# Patient Record
Sex: Male | Born: 1975 | Race: Black or African American | Hispanic: Yes | Marital: Married | Smoking: Never smoker
Health system: Southern US, Community
[De-identification: ages and names within clinical notes are randomized; demographics above are authoritative.]

## PROBLEM LIST (undated history)

## (undated) DIAGNOSIS — N200 Calculus of kidney: Secondary | ICD-10-CM

## (undated) DIAGNOSIS — R51 Headache: Secondary | ICD-10-CM

## (undated) DIAGNOSIS — R519 Headache, unspecified: Secondary | ICD-10-CM

## (undated) HISTORY — PX: KIDNEY STONE SURGERY: SHX686

## (undated) HISTORY — PX: URETERAL STENT PLACEMENT: SHX822

---

## 2012-02-21 ENCOUNTER — Emergency Department (HOSPITAL_COMMUNITY): Payer: BC Managed Care – PPO

## 2012-02-21 ENCOUNTER — Encounter (HOSPITAL_COMMUNITY): Payer: Self-pay | Admitting: Physical Medicine and Rehabilitation

## 2012-02-21 ENCOUNTER — Emergency Department (HOSPITAL_COMMUNITY)
Admission: EM | Admit: 2012-02-21 | Discharge: 2012-02-21 | Disposition: A | Discharge status: Home / Self Care | Payer: BC Managed Care – PPO | Attending: Emergency Medicine | Admitting: Emergency Medicine

## 2012-02-21 DIAGNOSIS — R110 Nausea: Secondary | ICD-10-CM | POA: Insufficient documentation

## 2012-02-21 DIAGNOSIS — R109 Unspecified abdominal pain: Secondary | ICD-10-CM | POA: Insufficient documentation

## 2012-02-21 DIAGNOSIS — N200 Calculus of kidney: Secondary | ICD-10-CM | POA: Insufficient documentation

## 2012-02-21 DIAGNOSIS — R350 Frequency of micturition: Secondary | ICD-10-CM | POA: Insufficient documentation

## 2012-02-21 DIAGNOSIS — M549 Dorsalgia, unspecified: Secondary | ICD-10-CM | POA: Insufficient documentation

## 2012-02-21 HISTORY — DX: Calculus of kidney: N20.0

## 2012-02-21 LAB — URINALYSIS, ROUTINE W REFLEX MICROSCOPIC
Bilirubin Urine: NEGATIVE
Glucose, UA: NEGATIVE mg/dL
Hgb urine dipstick: NEGATIVE
Ketones, ur: NEGATIVE mg/dL
Leukocytes, UA: NEGATIVE
Protein, ur: NEGATIVE mg/dL
pH: 7.5 (ref 5.0–8.0)

## 2012-02-21 MED ORDER — KETOROLAC TROMETHAMINE 30 MG/ML IJ SOLN
30.0000 mg | Freq: Once | INTRAMUSCULAR | Status: AC
  Administered 2012-02-21: 30 mg via INTRAVENOUS
  Filled 2012-02-21: qty 1

## 2012-02-21 MED ORDER — HYDROCODONE-ACETAMINOPHEN 5-500 MG PO TABS: 1.0000 | ORAL_TABLET | Freq: Four times a day (QID) | ORAL | Status: AC | PRN

## 2012-02-21 NOTE — ED Provider Notes (Signed)
History     CSN: [id number]  Arrival date & time 02/21/12  1152   First MD Initiated Contact with Patient 02/21/12 1430      Chief Complaint  Patient presents with  . Flank Pain    (Consider location/radiation/quality/duration/timing/severity/associated sxs/prior treatment) HPI Comments: 36 year old male with a history of kidney stones presents with left-sided flank pain radiating around to his abdomen. He rates the pain 7/10, constant, and cramping. Admits to associated nausea and diaphoresis, increased frequency and urgency. Denies any dysuria or hematuria. No vomiting. He was diagnosed with kidney stones 6 years ago in [place] where he is from. Last kidney stone flare up was 4 years ago which is also when his last CT scan was. He has not tried any alleviating factors for his pain.   Patient is a 36 y.o. male presenting with flank pain. The history is provided by the patient.  Flank Pain Associated symptoms include abdominal pain, diaphoresis and nausea. Pertinent negatives include no chest pain or vomiting.    Past Medical History  Diagnosis Date  . Kidney stones     No past surgical history on file.  History reviewed. No pertinent family history.  History  Substance Use Topics  . Smoking status: Never Smoker   . Smokeless tobacco: Not on file  . Alcohol Use: No      Review of Systems  Constitutional: Positive for diaphoresis. Negative for appetite change.  Respiratory: Negative for shortness of breath.   Cardiovascular: Negative for chest pain.  Gastrointestinal: Positive for nausea and abdominal pain. Negative for vomiting.  Genitourinary: Positive for urgency, frequency and flank pain. Negative for dysuria and hematuria.  Musculoskeletal: Positive for back pain.  Skin: Negative for color change.  Neurological: Negative for dizziness and light-headedness.    Allergies  Review of patient's allergies indicates no known allergies.  Home Medications  No  current outpatient prescriptions on file.  BP 116/85  Pulse 55  Temp 98.3 F (36.8 C) (Oral)  Resp 18  SpO2 99%  Physical Exam  Constitutional: He is oriented to person, place, and time. He appears well-developed and well-nourished. No distress.  HENT:  Head: Normocephalic and atraumatic.  Mouth/Throat: Oropharynx is clear and moist.  Eyes: Conjunctivae normal are normal.  Neck: Normal range of motion. Neck supple.  Cardiovascular: Normal rate, regular rhythm and normal heart sounds.   Pulmonary/Chest: Effort normal and breath sounds normal.  Abdominal: Soft. Normal appearance and bowel sounds are normal. There is no tenderness. There is CVA tenderness (left sided).  Musculoskeletal: Normal range of motion.  Neurological: He is alert and oriented to person, place, and time.  Skin: Skin is warm and dry. He is not diaphoretic.  Psychiatric: He has a normal mood and affect. His behavior is normal.    ED Course  Procedures (including critical care time)   Labs Reviewed  URINALYSIS, ROUTINE W REFLEX MICROSCOPIC   Results for orders placed during the hospital encounter of 02/21/12  URINALYSIS, ROUTINE W REFLEX MICROSCOPIC      Component Value Range   Color, Urine YELLOW  YELLOW   APPearance CLEAR  CLEAR   Specific Gravity, Urine 1.012  1.005 - 1.030   pH 7.5  5.0 - 8.0   Glucose, UA NEGATIVE  NEGATIVE mg/dL   Hgb urine dipstick NEGATIVE  NEGATIVE   Bilirubin Urine NEGATIVE  NEGATIVE   Ketones, ur NEGATIVE  NEGATIVE mg/dL   Protein, ur NEGATIVE  NEGATIVE mg/dL   Urobilinogen, UA 1.0  0.0 -  1.0 mg/dL   Nitrite NEGATIVE  NEGATIVE   Leukocytes, UA NEGATIVE  NEGATIVE    No results found.   No diagnosis found.    MDM  36 y/o male with hx of kidney stone with left sided flank pain. Urine without hematuria. Patient appears in NAD. No abdominal tenderness. CVA tenderness on left. Obtaining CT scan to r/o stone.  Case discussed with Dr. Judd Lien who will take over care of  patient at this time.        Trevor Mace, PA-C 02/21/12 1557

## 2012-02-21 NOTE — ED Provider Notes (Signed)
Medical screening examination/treatment/procedure(s) were performed by non-physician practitioner and as supervising physician I was immediately available for consultation/collaboration.  Cheri Guppy, MD 02/21/12 [phone]

## 2012-02-21 NOTE — ED Notes (Signed)
MD at bedside. 

## 2012-02-21 NOTE — ED Notes (Signed)
Pt presents to department for evaluation of L sided flank pain radiating to abdomen. Ongoing since Saturday. Also states unable to void x2 days. States he has (6) kidney stones. 7/10 cramping pain at the time. Also states nausea. He is conscious alert and oriented x4.

## 2012-02-21 NOTE — ED Provider Notes (Signed)
Care assumed from Dr. Nino Parsley and Johnnette Gourd.  I agree with their notes, assessment and plan.  The patient was awaiting ct scan for possible kidney stone.  The ct was reviewed and shows no evidence of obstructing calculi, however there were several calculi within the renal parenchyma.  He is feeling better with toradol and will be discharged to home.    Geoffery Lyons, MD 02/21/12 [phone]

## 2012-07-05 ENCOUNTER — Ambulatory Visit (INDEPENDENT_AMBULATORY_CARE_PROVIDER_SITE_OTHER): Payer: BC Managed Care – PPO | Admitting: Internal Medicine

## 2012-07-05 VITALS — BP 118/88 | HR 86 | Temp 98.5°F | Resp 16 | Ht 69.0 in | Wt 199.0 lb

## 2012-07-05 DIAGNOSIS — Z8619 Personal history of other infectious and parasitic diseases: Secondary | ICD-10-CM

## 2012-07-05 DIAGNOSIS — N200 Calculus of kidney: Secondary | ICD-10-CM

## 2012-07-05 DIAGNOSIS — H9190 Unspecified hearing loss, unspecified ear: Secondary | ICD-10-CM

## 2012-07-05 DIAGNOSIS — R001 Bradycardia, unspecified: Secondary | ICD-10-CM

## 2012-07-05 DIAGNOSIS — G729 Myopathy, unspecified: Secondary | ICD-10-CM

## 2012-07-05 DIAGNOSIS — Z0000 Encounter for general adult medical examination without abnormal findings: Secondary | ICD-10-CM

## 2012-07-05 LAB — POCT URINALYSIS DIPSTICK
Blood, UA: NEGATIVE
Leukocytes, UA: NEGATIVE
Nitrite, UA: NEGATIVE
Protein, UA: NEGATIVE
Urobilinogen, UA: 0.2
pH, UA: 6

## 2012-07-05 LAB — POCT CBC
Granulocyte percent: 64.5 %G (ref 37–80)
MCV: 90.8 fL (ref 80–97)
MID (cbc): 0.5 (ref 0–0.9)
MPV: 8.6 fL (ref 0–99.8)
POC LYMPH PERCENT: 28.6 %L (ref 10–50)
POC MID %: 6.9 %M (ref 0–12)
Platelet Count, POC: 357 10*3/uL (ref 142–424)
RBC: 5.06 M/uL (ref 4.69–6.13)
RDW, POC: 13.5 %

## 2012-07-05 LAB — COMPREHENSIVE METABOLIC PANEL
Albumin: 4.5 g/dL (ref 3.5–5.2)
BUN: 17 mg/dL (ref 6–23)
Calcium: 9.7 mg/dL (ref 8.4–10.5)
Chloride: 105 mEq/L (ref 96–112)
Creat: 0.86 mg/dL (ref 0.50–1.35)
Glucose, Bld: 90 mg/dL (ref 70–99)
Potassium: 4.4 mEq/L (ref 3.5–5.3)

## 2012-07-05 LAB — LIPID PANEL
Cholesterol: 226 mg/dL — ABNORMAL HIGH (ref 0–200)
Total CHOL/HDL Ratio: 5.7 Ratio
Triglycerides: 99 mg/dL (ref <150)
VLDL: 20 mg/dL (ref 0–40)

## 2012-07-05 LAB — POCT UA - MICROSCOPIC ONLY
Bacteria, U Microscopic: NEGATIVE
Casts, Ur, LPF, POC: NEGATIVE
Crystals, Ur, HPF, POC: NEGATIVE
RBC, urine, microscopic: NEGATIVE

## 2012-07-05 LAB — TSH: TSH: 1.458 u[IU]/mL (ref 0.350–4.500)

## 2012-07-05 NOTE — Patient Instructions (Addendum)
Kidney Stones Kidney stones (ureteral lithiasis) are deposits that form inside your kidneys. The intense pain is caused by the stone moving through the urinary tract. When the stone moves, the ureter goes into spasm around the stone. The stone is usually passed in the urine.  CAUSES   A disorder that makes certain neck glands produce too much parathyroid hormone (primary hyperparathyroidism).  A buildup of uric acid crystals.  Narrowing (stricture) of the ureter.  A kidney obstruction present at birth (congenital obstruction).  Previous surgery on the kidney or ureters.  Numerous kidney infections. SYMPTOMS   Feeling sick to your stomach (nauseous).  Throwing up (vomiting).  Blood in the urine (hematuria).  Pain that usually spreads (radiates) to the groin.  Frequency or urgency of urination. DIAGNOSIS   Taking a history and physical exam.  Blood or urine tests.  Computerized X-ray scan (CT scan).  Occasionally, an examination of the inside of the urinary bladder (cystoscopy) is performed. TREATMENT   Observation.  Increasing your fluid intake.  Surgery may be needed if you have severe pain or persistent obstruction. The size, location, and chemical composition are all important variables that will determine the proper choice of action for you. Talk to your caregiver to better understand your situation so that you will minimize the risk of injury to yourself and your kidney.  HOME CARE INSTRUCTIONS   Drink enough water and fluids to keep your urine clear or pale yellow.  Strain all urine through the provided strainer. Keep all particulate matter and stones for your caregiver to see. The stone causing the pain may be as small as a grain of salt. It is very important to use the strainer each and every time you pass your urine. The collection of your stone will allow your caregiver to analyze it and verify that a stone has actually passed.  Only take over-the-counter or  prescription medicines for pain, discomfort, or fever as directed by your caregiver.  Make a follow-up appointment with your caregiver as directed.  Get follow-up X-rays if required. The absence of pain does not always mean that the stone has passed. It may have only stopped moving. If the urine remains completely obstructed, it can cause loss of kidney function or even complete destruction of the kidney. It is your responsibility to make sure X-rays and follow-ups are completed. Ultrasounds of the kidney can show blockages and the status of the kidney. Ultrasounds are not associated with any radiation and can be performed easily in a matter of minutes. SEEK IMMEDIATE MEDICAL CARE IF:   Pain cannot be controlled with the prescribed medicine.  You have a fever.  The severity or intensity of pain increases over 18 hours and is not relieved by pain medicine.  You develop a new onset of abdominal pain.  You feel faint or pass out. MAKE SURE YOU:   Understand these instructions.  Will watch your condition.  Will get help right away if you are not doing well or get worse. Document Released: 05/29/2005 Document Revised: 08/21/2011 Document Reviewed: 09/24/2009 [facility] Patient Information 2013 [place], [place]. Bradycardia You have a slow heart rate. This is called bradycardia. At rest, the normal heart rate is between 60-100 beats per minute. A slow heart may cause weakness, dizziness, loss of consciousness, and shortness of breath. This gets worse when you are active.  The medical causes of bradycardia can include:  Heart and thyroid problems.  High potassium.  Side effects of some medicines. Well-trained athletes may  have heart rates as slow as 42 beats per minute. Evaluation of bradycardia may require an electrocardiogram (ECG), blood tests, and possibly other heart studies.  Bradycardia due to heart disease can be a serious problem. Damage to the heart's electrical system may require  a temporary or permanent pacemaker. Beta-blocker drugs, digoxin, and other medicines used to control blood pressure and heart rhythms will also slow the heart. These medicines may need to be used in lower doses, or be stopped, if you have problems from the bradycardia.  SEEK IMMEDIATE MEDICAL CARE IF:   You develop fainting, extreme weakness, shortness of breath, or fever.  You develop severe chest or abdominal pain, repeated vomiting, or dehydration.  You become sweaty and weak. MAKE SURE YOU:   Understand these instructions.  Will watch your condition.  Will get help right away if you are not doing well or get worse. Document Released: 05/29/2005 Document Revised: 08/21/2011 Document Reviewed: 08/26/2008 [facility] Patient Information 2013 [place], [place].

## 2012-07-05 NOTE — Progress Notes (Signed)
Subjective:    Patient ID: Bruce Chapman, male    DOB: [birth year], 37 y.o.   MRN: [id number]  HPI Here for cpe. Healthy no smoke and no ETOH. Exercises daily, works hard at [employer]. Past hx of kidney stones starting age 49, hereditary stones. Hearing loss many infections right ear, hx myringotomys. Grand father had cancer of some type. Hx of urologic surgery to remove obstructed stones Hx of chlamydia exposure  Review of Systems  Constitutional: Negative.   HENT: Negative.   Eyes: Negative.   Respiratory: Negative.   Cardiovascular: Negative.   Gastrointestinal: Negative.   Genitourinary: Negative.   Musculoskeletal: Negative.   Hematological: Negative.   Psychiatric/Behavioral: Negative.        Objective:   Physical Exam  Vitals reviewed. Constitutional: He is oriented to person, place, and time. He appears well-developed and well-nourished.  HENT:  Right Ear: External ear normal. No swelling or tenderness. No foreign bodies. Tympanic membrane is scarred. Tympanic membrane is not erythematous. Tympanic membrane mobility is abnormal. No middle ear effusion. Decreased hearing is noted.  Left Ear: External ear normal.  Nose: Nose normal.  Mouth/Throat: Oropharynx is clear and moist.  Eyes: Pupils are equal, round, and reactive to light. No scleral icterus.  Neck: Neck supple. No thyromegaly present.  Cardiovascular: Normal rate, regular rhythm and normal heart sounds.   Pulmonary/Chest: Effort normal and breath sounds normal.  Abdominal: Bowel sounds are normal. He exhibits no mass. There is no tenderness.  Genitourinary: Penis normal.  Musculoskeletal: Normal range of motion.  Lymphadenopathy:    He has no cervical adenopathy.  Neurological: He is alert and oriented to person, place, and time. No cranial nerve deficit. He exhibits normal muscle tone. Coordination normal.  Skin: Skin is warm and dry. No rash noted.  Psychiatric: He has a normal mood and affect. His behavior  is normal. Judgment and thought content normal.   Results for orders placed in visit on 07/05/12  POCT CBC      Component Value Range   WBC 6.6  4.6 - 10.2 K/uL   Lymph, poc 1.9  0.6 - 3.4   POC LYMPH PERCENT 28.6  10 - 50 %L   MID (cbc) 0.5  0 - 0.9   POC MID % 6.9  0 - 12 %M   POC Granulocyte 4.3  2 - 6.9   Granulocyte percent 64.5  37 - 80 %G   RBC 5.06  4.69 - 6.13 M/uL   Hemoglobin 14.6  14.1 - 18.1 g/dL   HCT, POC 91.4  78.2 - 53.7 %   MCV 90.8  80 - 97 fL   MCH, POC 28.9  27 - 31.2 pg   MCHC 31.8  31.8 - 35.4 g/dL   RDW, POC 95.6     Platelet Count, POC 357  142 - 424 K/uL   MPV 8.6  0 - 99.8 fL  POCT UA - MICROSCOPIC ONLY      Component Value Range   WBC, Ur, HPF, POC neg     RBC, urine, microscopic neg     Bacteria, U Microscopic neg     Mucus, UA neg     Epithelial cells, urine per micros neg     Crystals, Ur, HPF, POC neg     Casts, Ur, LPF, POC neg     Yeast, UA neg    POCT URINALYSIS DIPSTICK      Component Value Range   Color, UA yellow     Clarity,  UA clear     Glucose, UA neg     Bilirubin, UA neg     Ketones, UA neg     Spec Grav, UA 1.025     Blood, UA neg     pH, UA 6.0     Protein, UA neg     Urobilinogen, UA 0.2     Nitrite, UA neg     Leukocytes, UA Negative      EKG sinus bradycardia audiogram     Assessment & Plan:  Healthy exam

## 2012-07-06 LAB — GC/CHLAMYDIA PROBE AMP, URINE
Chlamydia, Swab/Urine, PCR: NEGATIVE
GC Probe Amp, Urine: NEGATIVE

## 2012-07-07 ENCOUNTER — Encounter: Payer: Self-pay | Admitting: Radiology

## 2012-12-02 ENCOUNTER — Emergency Department (HOSPITAL_COMMUNITY): Payer: BC Managed Care – PPO

## 2012-12-02 ENCOUNTER — Emergency Department (HOSPITAL_COMMUNITY)
Admission: EM | Admit: 2012-12-02 | Discharge: 2012-12-03 | Disposition: A | Discharge status: Home / Self Care | Payer: BC Managed Care – PPO | Attending: Emergency Medicine | Admitting: Emergency Medicine

## 2012-12-02 ENCOUNTER — Ambulatory Visit (INDEPENDENT_AMBULATORY_CARE_PROVIDER_SITE_OTHER): Payer: BC Managed Care – PPO | Admitting: Family Medicine

## 2012-12-02 ENCOUNTER — Encounter (HOSPITAL_COMMUNITY): Payer: Self-pay | Admitting: *Deleted

## 2012-12-02 VITALS — BP 150/102 | HR 75 | Temp 98.0°F | Resp 17 | Ht 69.0 in | Wt 209.0 lb

## 2012-12-02 DIAGNOSIS — M549 Dorsalgia, unspecified: Secondary | ICD-10-CM | POA: Insufficient documentation

## 2012-12-02 DIAGNOSIS — N201 Calculus of ureter: Secondary | ICD-10-CM | POA: Insufficient documentation

## 2012-12-02 DIAGNOSIS — R3915 Urgency of urination: Secondary | ICD-10-CM | POA: Insufficient documentation

## 2012-12-02 DIAGNOSIS — R300 Dysuria: Secondary | ICD-10-CM | POA: Insufficient documentation

## 2012-12-02 DIAGNOSIS — Z87442 Personal history of urinary calculi: Secondary | ICD-10-CM | POA: Insufficient documentation

## 2012-12-02 DIAGNOSIS — R109 Unspecified abdominal pain: Secondary | ICD-10-CM | POA: Insufficient documentation

## 2012-12-02 DIAGNOSIS — R110 Nausea: Secondary | ICD-10-CM | POA: Insufficient documentation

## 2012-12-02 LAB — URINALYSIS, ROUTINE W REFLEX MICROSCOPIC
Bilirubin Urine: NEGATIVE
Ketones, ur: NEGATIVE mg/dL
Protein, ur: NEGATIVE mg/dL
Urobilinogen, UA: 1 mg/dL (ref 0.0–1.0)

## 2012-12-02 LAB — BASIC METABOLIC PANEL
BUN: 19 mg/dL (ref 6–23)
Creatinine, Ser: 0.9 mg/dL (ref 0.50–1.35)
GFR calc non Af Amer: >90 mL/min (ref >90)
Glucose, Bld: 104 mg/dL — ABNORMAL HIGH (ref 70–99)
Potassium: 3.9 mEq/L (ref 3.5–5.1)

## 2012-12-02 LAB — CBC WITH DIFFERENTIAL/PLATELET
Basophils Relative: 0 % (ref 0–1)
Eosinophils Absolute: 0 10*3/uL (ref 0.0–0.7)
Hemoglobin: 13.8 g/dL (ref 13.0–17.0)
MCH: 28.5 pg (ref 26.0–34.0)
MCHC: 33.8 g/dL (ref 30.0–36.0)
Monocytes Absolute: 0.7 10*3/uL (ref 0.1–1.0)
Monocytes Relative: 6 % (ref 3–12)
Neutrophils Relative %: 84 % — ABNORMAL HIGH (ref 43–77)
RDW: 12.9 % (ref 11.5–15.5)

## 2012-12-02 LAB — URINE MICROSCOPIC-ADD ON

## 2012-12-02 MED ORDER — ONDANSETRON HCL 4 MG/2ML IJ SOLN
4.0000 mg | Freq: Once | INTRAMUSCULAR | Status: AC
Start: 2012-12-02 — End: 2012-12-02
  Administered 2012-12-02: 4 mg via INTRAVENOUS
  Filled 2012-12-02: qty 2

## 2012-12-02 MED ORDER — HYDROMORPHONE HCL PF 1 MG/ML IJ SOLN
1.0000 mg | Freq: Once | INTRAMUSCULAR | Status: AC
  Administered 2012-12-02: 1 mg via INTRAVENOUS
  Filled 2012-12-02: qty 1

## 2012-12-02 MED ORDER — KETOROLAC TROMETHAMINE 60 MG/2ML IM SOLN
60.0000 mg | Freq: Once | INTRAMUSCULAR | Status: AC
  Administered 2012-12-02: 60 mg via INTRAMUSCULAR

## 2012-12-02 MED ORDER — TAMSULOSIN HCL 0.4 MG PO CAPS: 0.4000 mg | ORAL_CAPSULE | Freq: Every day | ORAL | Status: DC

## 2012-12-02 MED ORDER — OXYCODONE-ACETAMINOPHEN 5-325 MG PO TABS
1.0000 | ORAL_TABLET | Freq: Four times a day (QID) | ORAL | Status: DC | PRN
Start: 2012-12-02 — End: 2016-01-16

## 2012-12-02 MED ORDER — HYDROMORPHONE HCL PF 1 MG/ML IJ SOLN
1.0000 mg | Freq: Once | INTRAMUSCULAR | Status: AC
  Administered 2012-12-03: 1 mg via INTRAVENOUS
  Filled 2012-12-02: qty 1

## 2012-12-02 NOTE — ED Notes (Signed)
Pt states started today at 5pm with right flank pain; saw pcp and given shot of tramadol; told to come to ER by pcp due to previous history of kidney stone; states last voiding this morning; having sharp tightness pain lower right abd flank area

## 2012-12-02 NOTE — Patient Instructions (Signed)
*  RADIOLOGY REPORT*  Clinical Data: Left-sided flank pain. Nephrolithiasis.  CT ABDOMEN AND PELVIS WITHOUT CONTRAST  Technique: Multidetector CT imaging of the abdomen and pelvis was  performed following the standard protocol without intravenous  contrast.  Comparison: None.  Findings: Tiny less than 5 mm nonobstructing calculi are seen in  both kidneys, right side greater than left. No evidence of  perinephric fluid or inflammatory changes. No evidence of  hydronephrosis. No evidence of ureteral calculi or dilatation. No  bladder calculi identified.  The other abdominal parenchymal organs have a normal appearance on  this noncontrast study. Gallbladder is unremarkable. No soft  tissue masses or lymphadenopathy identified. No evidence of  inflammatory process or abnormal fluid collections. No evidence of  dilated bowel loops. Normal appendix is visualized.  IMPRESSION:  Nonobstructing bilateral nephrolithiasis. No evidence of ureteral  calculi, hydronephrosis, or other acute findings.  Original Report Authenticated By: Danae Orleans, M.D.

## 2012-12-02 NOTE — Progress Notes (Signed)
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  Has been drinking fluids, but has not peed since 10 am today.  Patient has h/o left kidney stones.  He's had surgery for retained stone (x 3).  Objective:  Acute distress, holding right flank and restless moving about room  Jumps when lightly touching right flank  *RADIOLOGY REPORT*  Clinical Data: Left-sided flank pain. Nephrolithiasis.  CT ABDOMEN AND PELVIS WITHOUT CONTRAST  Technique: Multidetector CT imaging of the abdomen and pelvis was  performed following the standard protocol without intravenous  contrast.  Comparison: None.  Findings: Tiny less than 5 mm nonobstructing calculi are seen in  both kidneys, right side greater than left. No evidence of  perinephric fluid or inflammatory changes. No evidence of  hydronephrosis. No evidence of ureteral calculi or dilatation. No  bladder calculi identified.  The other abdominal parenchymal organs have a normal appearance on  this noncontrast study. Gallbladder is unremarkable. No soft  tissue masses or lymphadenopathy identified. No evidence of  inflammatory process or abnormal fluid collections. No evidence of  dilated bowel loops. Normal appendix is visualized.  IMPRESSION:  Nonobstructing bilateral nephrolithiasis. No evidence of ureteral  calculi, hydronephrosis, or other acute findings.  Original Report Authenticated By: Danae Orleans, M.D.    Patient was unable to void during the evaluation. He did experience about 30% decrease in his pain, which migrated to the right groin.  Assessment: Acute kidney stone, only partially relieved with Toradol  Plan: Go [facility] department for further evaluation and treatment.  Signed, Sheila Oats.D.

## 2012-12-02 NOTE — Addendum Note (Signed)
Addended by: Braxton Feathers on: 12/02/2012 07:23 PM   Modules accepted: Orders

## 2012-12-02 NOTE — ED Provider Notes (Signed)
History    CSN: [id number] Arrival date & time 12/02/12  4098  First MD Initiated Contact with Patient 12/02/12 2216     Chief Complaint  Patient presents with  . Flank Pain   (Consider location/radiation/quality/duration/timing/severity/associated sxs/prior Treatment) HPI Comments: 37 year old male with medical history of kidney stones presents emergency department complaining of gradual onset right-sided flank pain beginning around 5:00 PM today. Pain described as sharp, radiating around to his flank and suprapubic region, rated 10 out of 10. He went to his PCP at [facility] around 6:00 PM, was given tramadol with mild relief and advised to go to the ER for further evaluation. States he has been unable to urinate since 10:00 this morning. When giving a urine sample in the emergency department he states it was very difficult to get the urine out, positive for dysuria. Symptoms feel similar to prior kidney stones, however in the past were on the left side. Admits to associated nausea without vomiting. Denies fever or chills. He does not have a urologist as he used to live in [place], and did not followup with urology after last presentation to the emergency department in September for kidney stones.  Patient is a 37 y.o. male presenting with flank pain. The history is provided by the patient.  Flank Pain Associated symptoms include nausea. Pertinent negatives include no abdominal pain, chills, fever or vomiting.   Past Medical History  Diagnosis Date  . Kidney stones    Past Surgical History  Procedure Laterality Date  . Kidney stone surgery     No family history on file. History  Substance Use Topics  . Smoking status: Never Smoker   . Smokeless tobacco: Not on file  . Alcohol Use: No    Review of Systems  Constitutional: Negative for fever and chills.  Gastrointestinal: Positive for nausea. Negative for vomiting and abdominal pain.  Genitourinary: Positive for  dysuria, urgency, flank pain, decreased urine volume and difficulty urinating.  Musculoskeletal: Positive for back pain.  All other systems reviewed and are negative.    Allergies  Review of patient's allergies indicates no known allergies.  Home Medications  No current outpatient prescriptions on file. BP 126/82  Pulse 62  Temp(Src) 98 F (36.7 C) (Oral)  Resp 15  SpO2 95% Physical Exam  Nursing note and vitals reviewed. Constitutional: He is oriented to person, place, and time. He appears well-developed and well-nourished.  Appears uncomfortable.  HENT:  Head: Normocephalic and atraumatic.  Mouth/Throat: Oropharynx is clear and moist.  Eyes: Conjunctivae are normal.  Neck: Normal range of motion. Neck supple.  Cardiovascular: Normal rate, regular rhythm and normal heart sounds.   Pulmonary/Chest: Effort normal and breath sounds normal.  Abdominal: Normal appearance and bowel sounds are normal. He exhibits no distension and no mass. There is tenderness. There is CVA tenderness (right). There is no rigidity, no rebound and no guarding.    Musculoskeletal: Normal range of motion. He exhibits no edema.  Neurological: He is alert and oriented to person, place, and time.  Skin: Skin is warm and dry. He is not diaphoretic.  Psychiatric: He has a normal mood and affect. His behavior is normal.    ED Course  Procedures (including critical care time) Labs Reviewed  URINALYSIS, ROUTINE W REFLEX MICROSCOPIC - Abnormal; Notable for the following:    APPearance CLOUDY (*)    Specific Gravity, Urine 1.033 (*)    Hgb urine dipstick LARGE (*)    All other components within  normal limits  BASIC METABOLIC PANEL - Abnormal; Notable for the following:    Glucose, Bld 104 (*)    All other components within normal limits  CBC WITH DIFFERENTIAL - Abnormal; Notable for the following:    WBC 11.9 (*)    Neutrophils Relative % 84 (*)    Neutro Abs 10.0 (*)    Lymphocytes Relative 10 (*)      All other components within normal limits  URINE MICROSCOPIC-ADD ON   Ct Abdomen Pelvis Wo Contrast  12/02/2012   *RADIOLOGY REPORT*  Clinical Data: Right flank pain.  CT ABDOMEN AND PELVIS WITHOUT CONTRAST  Technique:  Multidetector CT imaging of the abdomen and pelvis was performed following the standard protocol without intravenous contrast.  Comparison: 02/21/2012  Findings: Lung bases are clear.  No effusions.  Heart is normal size.  Liver, gallbladder, spleen, pancreas, adrenals have an unremarkable unenhanced appearance.  Punctate nonobstructing bilateral renal stones.  There is mild right hydronephrosis due to a 4 mm obstructing proximal right ureteral stone.  No additional ureteral stones.  Urinary bladder is decompressed.  Appendix is visualized and is normal. Bowel grossly unremarkable. No free fluid, free air, or adenopathy.  No acute bony abnormality.  IMPRESSION: 4 mm proximal right ureteral stone with mild right hydronephrosis.  Punctate bilateral nephrolithiasis.   Original Report Authenticated By: Charlett Nose, M.D.   1. Ureteral stone   2. Flank pain     MDM  4 mm proximal right ureteral stone with mild right hydronephrosis. Urine without infection. Pain beginning to improve after 1 mg IV Dilaudid. No longer nauseated. Will discharge patient with Percocet and Flomax, urine strainer and urology followup. Return precautions discussed. Patient states understanding of plan and is agreeable.  Trevor Mace, PA-C 12/02/12 2353

## 2012-12-03 NOTE — ED Provider Notes (Signed)
Medical screening examination/treatment/procedure(s) were performed by non-physician practitioner and as supervising physician I was immediately available for consultation/collaboration.  Zyad Boomer, MD 12/03/12 0115 

## 2014-05-11 ENCOUNTER — Emergency Department: Payer: Self-pay | Admitting: Emergency Medicine

## 2014-05-11 LAB — CBC WITH DIFFERENTIAL/PLATELET
BASOS ABS: 0 10*3/uL (ref 0.0–0.1)
BASOS PCT: 0.2 %
EOS ABS: 0 10*3/uL (ref 0.0–0.7)
Eosinophil %: 0.4 %
HCT: 41.1 % (ref 40.0–52.0)
HGB: 13.8 g/dL (ref 13.0–18.0)
LYMPHS ABS: 1.5 10*3/uL (ref 1.0–3.6)
Lymphocyte %: 19.4 %
MCH: 29.6 pg (ref 26.0–34.0)
MCHC: 33.6 g/dL (ref 32.0–36.0)
MCV: 88 fL (ref 80–100)
MONOS PCT: 5.9 %
Monocyte #: 0.4 x10 3/mm (ref 0.2–1.0)
NEUTROS PCT: 74.1 %
Neutrophil #: 5.6 10*3/uL (ref 1.4–6.5)
PLATELETS: 281 10*3/uL (ref 150–440)
RBC: 4.66 10*6/uL (ref 4.40–5.90)
RDW: 13.6 % (ref 11.5–14.5)
WBC: 7.5 10*3/uL (ref 3.8–10.6)

## 2014-05-11 LAB — BASIC METABOLIC PANEL
ANION GAP: 5 — AB (ref 7–16)
BUN: 19 mg/dL — ABNORMAL HIGH (ref 7–18)
CHLORIDE: 107 mmol/L (ref 98–107)
CO2: 30 mmol/L (ref 21–32)
Calcium, Total: 8.5 mg/dL (ref 8.5–10.1)
Creatinine: 0.92 mg/dL (ref 0.60–1.30)
EGFR (African American): >60
Glucose: 71 mg/dL (ref 65–99)
Osmolality: 284 (ref 275–301)
Potassium: 4.2 mmol/L (ref 3.5–5.1)
Sodium: 142 mmol/L (ref 136–145)

## 2014-11-05 ENCOUNTER — Emergency Department: Payer: Self-pay

## 2014-11-05 DIAGNOSIS — R51 Headache: Secondary | ICD-10-CM | POA: Insufficient documentation

## 2014-11-05 DIAGNOSIS — Z79899 Other long term (current) drug therapy: Secondary | ICD-10-CM | POA: Insufficient documentation

## 2014-11-05 DIAGNOSIS — N200 Calculus of kidney: Secondary | ICD-10-CM | POA: Insufficient documentation

## 2014-11-05 LAB — COMPREHENSIVE METABOLIC PANEL
ALT: 38 U/L (ref 17–63)
ANION GAP: 8 (ref 5–15)
AST: 30 U/L (ref 15–41)
Albumin: 4.3 g/dL (ref 3.5–5.0)
Alkaline Phosphatase: 55 U/L (ref 38–126)
BUN: 17 mg/dL (ref 6–20)
CHLORIDE: 105 mmol/L (ref 101–111)
CO2: 27 mmol/L (ref 22–32)
Calcium: 9.2 mg/dL (ref 8.9–10.3)
Creatinine, Ser: 0.98 mg/dL (ref 0.61–1.24)
GFR calc Af Amer: >60 mL/min (ref >60)
GLUCOSE: 118 mg/dL — AB (ref 65–99)
Potassium: 4.1 mmol/L (ref 3.5–5.1)
Sodium: 140 mmol/L (ref 135–145)
Total Bilirubin: 0.4 mg/dL (ref 0.3–1.2)
Total Protein: 7.7 g/dL (ref 6.5–8.1)

## 2014-11-05 LAB — CBC
HCT: 43.6 % (ref 40.0–52.0)
HEMOGLOBIN: 14.8 g/dL (ref 13.0–18.0)
MCH: 29.2 pg (ref 26.0–34.0)
MCHC: 33.9 g/dL (ref 32.0–36.0)
MCV: 86.1 fL (ref 80.0–100.0)
PLATELETS: 279 10*3/uL (ref 150–440)
RBC: 5.06 MIL/uL (ref 4.40–5.90)
RDW: 13.1 % (ref 11.5–14.5)
WBC: 7.4 10*3/uL (ref 3.8–10.6)

## 2014-11-05 MED ORDER — OXYCODONE-ACETAMINOPHEN 5-325 MG PO TABS
ORAL_TABLET | ORAL | Status: AC
  Administered 2014-11-05: 23:00:00
  Filled 2014-11-05: qty 1

## 2014-11-05 NOTE — ED Notes (Signed)
Patient presents with right flank and lower back pain. Has 8 known kidney stones that he has not passed. Asking for something for pain, unable to urinate completely.

## 2014-11-06 ENCOUNTER — Emergency Department
Admission: EM | Admit: 2014-11-06 | Discharge: 2014-11-06 | Disposition: A | Discharge status: Home / Self Care | Payer: Self-pay | Attending: Emergency Medicine | Admitting: Emergency Medicine

## 2014-11-06 ENCOUNTER — Emergency Department: Payer: Self-pay

## 2014-11-06 ENCOUNTER — Encounter: Payer: Self-pay | Admitting: General Practice

## 2014-11-06 DIAGNOSIS — R52 Pain, unspecified: Secondary | ICD-10-CM

## 2014-11-06 DIAGNOSIS — N200 Calculus of kidney: Secondary | ICD-10-CM

## 2014-11-06 DIAGNOSIS — R1011 Right upper quadrant pain: Secondary | ICD-10-CM

## 2014-11-06 HISTORY — DX: Headache: R51

## 2014-11-06 HISTORY — DX: Headache, unspecified: R51.9

## 2014-11-06 LAB — URINALYSIS COMPLETE WITH MICROSCOPIC (ARMC ONLY)
BILIRUBIN URINE: NEGATIVE
GLUCOSE, UA: NEGATIVE mg/dL
KETONES UR: NEGATIVE mg/dL
LEUKOCYTES UA: NEGATIVE
NITRITE: NEGATIVE
PH: 5 (ref 5.0–8.0)
Protein, ur: NEGATIVE mg/dL
Specific Gravity, Urine: 1.012 (ref 1.005–1.030)

## 2014-11-06 LAB — LIPASE, BLOOD: Lipase: 30 U/L (ref 22–51)

## 2014-11-06 MED ORDER — OXYCODONE-ACETAMINOPHEN 10-325 MG PO TABS: 1.0000 | ORAL_TABLET | Freq: Four times a day (QID) | ORAL | Status: DC | PRN

## 2014-11-06 MED ORDER — ONDANSETRON HCL 4 MG/2ML IJ SOLN
INTRAMUSCULAR | Status: AC
  Administered 2014-11-06: 4 mg via INTRAVENOUS
  Filled 2014-11-06: qty 2

## 2014-11-06 MED ORDER — ONDANSETRON HCL 4 MG/2ML IJ SOLN
4.0000 mg | Freq: Once | INTRAMUSCULAR | Status: AC
  Administered 2014-11-06: 4 mg via INTRAVENOUS

## 2014-11-06 MED ORDER — TAMSULOSIN HCL 0.4 MG PO CAPS: 0.4000 mg | ORAL_CAPSULE | Freq: Every day | ORAL | Status: DC

## 2014-11-06 MED ORDER — OXYCODONE-ACETAMINOPHEN 5-325 MG PO TABS
2.0000 | ORAL_TABLET | Freq: Once | ORAL | Status: AC
  Administered 2014-11-06: 2 via ORAL

## 2014-11-06 MED ORDER — HYDROMORPHONE HCL 1 MG/ML IJ SOLN
1.0000 mg | Freq: Once | INTRAMUSCULAR | Status: AC
  Administered 2014-11-06: 1 mg via INTRAVENOUS

## 2014-11-06 MED ORDER — ONDANSETRON 4 MG PO TBDP
4.0000 mg | ORAL_TABLET | Freq: Once | ORAL | Status: AC
  Administered 2014-11-06: 4 mg via ORAL

## 2014-11-06 MED ORDER — HYDROMORPHONE HCL 1 MG/ML IJ SOLN
INTRAMUSCULAR | Status: AC
  Administered 2014-11-06: 1 mg via INTRAVENOUS
  Filled 2014-11-06: qty 1

## 2014-11-06 MED ORDER — TAMSULOSIN HCL 0.4 MG PO CAPS
0.4000 mg | ORAL_CAPSULE | Freq: Once | ORAL | Status: AC
  Administered 2014-11-06: 0.4 mg via ORAL

## 2014-11-06 MED ORDER — SODIUM CHLORIDE 0.9 % IV BOLUS (SEPSIS)
1000.0000 mL | Freq: Once | INTRAVENOUS | Status: AC
  Administered 2014-11-06: 1000 mL via INTRAVENOUS

## 2014-11-06 MED ORDER — ONDANSETRON HCL 4 MG PO TABS: 4.0000 mg | ORAL_TABLET | Freq: Three times a day (TID) | ORAL | Status: DC | PRN

## 2014-11-06 MED ORDER — TAMSULOSIN HCL 0.4 MG PO CAPS
ORAL_CAPSULE | ORAL | Status: AC
  Administered 2014-11-06: 0.4 mg via ORAL
  Filled 2014-11-06: qty 1

## 2014-11-06 MED ORDER — ONDANSETRON 4 MG PO TBDP
ORAL_TABLET | ORAL | Status: AC
  Administered 2014-11-06: 4 mg via ORAL
  Filled 2014-11-06: qty 1

## 2014-11-06 MED ORDER — OXYCODONE-ACETAMINOPHEN 5-325 MG PO TABS
ORAL_TABLET | ORAL | Status: AC
  Administered 2014-11-06: 2 via ORAL
  Filled 2014-11-06: qty 2

## 2014-11-06 NOTE — ED Provider Notes (Signed)
[facility] Emergency Department Provider Note  ____________________________________________  Time seen: Approximately 1:33 AM  I have reviewed the triage vital signs and the nursing notes.   HISTORY  Chief Complaint Flank Pain    HPI Bruce Chapman is a 39 y.o. male who presents with sudden onset sharp right upper quadrant pain radiating to right flank. Patient describes 10/10 pain onset approximately 8 PM tonight. Patient ate last at 10 AM; has been having frequent headaches and has not felt like eating. Pain is associated with nausea only. Patient has extensive history of kidney stones but says he has never had a kidney stone on the right side. Also complains of difficulty urinating with bloody urine. Nothing makes the pain better or worse. Denies fever, chills, chest pain, shortness of breath, testicular pain or swelling, numbness, tingling, weakness.   Past Medical History  Diagnosis Date  . Kidney stones   . Generalized headaches     There are no active problems to display for this patient.   Past Surgical History  Procedure Laterality Date  . Kidney stone surgery    . Ureteral stent placement      Current Outpatient Rx  Name  Route  Sig  Dispense  Refill  . ondansetron (ZOFRAN) 4 MG tablet   Oral   Take 1 tablet (4 mg total) by mouth every 8 (eight) hours as needed for nausea or vomiting.   20 tablet   1   . oxyCODONE-acetaminophen (PERCOCET) 10-325 MG per tablet   Oral   Take 1 tablet by mouth every 6 (six) hours as needed for pain.   20 tablet   0   . oxyCODONE-acetaminophen (PERCOCET) 5-325 MG per tablet   Oral   Take 1-2 tablets by mouth every 6 (six) hours as needed for pain.   20 tablet   0   . tamsulosin (FLOMAX) 0.4 MG CAPS capsule   Oral   Take 1 capsule (0.4 mg total) by mouth daily.   14 capsule   0   . tamsulosin (FLOMAX) 0.4 MG CAPS   Oral   Take 1 capsule (0.4 mg total) by mouth daily.   30 capsule   0      Allergies Review of patient's allergies indicates no known allergies.  Family history Male family members with cholelithiasis.  Social History History  Substance Use Topics  . Smoking status: Never Smoker   . Smokeless tobacco: Not on file  . Alcohol Use: No    Review of Systems Constitutional: No fever/chills Eyes: No visual changes. ENT: No sore throat. Cardiovascular: Denies chest pain. Respiratory: Denies shortness of breath. Gastrointestinal: Positive for abdominal pain.  Positive for nausea. No vomiting.  No diarrhea.  No constipation. Genitourinary: Positive for urinary hesitancy. Negative for dysuria. Musculoskeletal: Positive for back pain. Skin: Negative for rash. Neurological: Positive for headache. Negative for focal weakness or numbness.  10-point ROS otherwise negative.  ____________________________________________   PHYSICAL EXAM:  VITAL SIGNS: ED Triage Vitals  Enc Vitals Group     BP 11/05/14 2139 140/100 mmHg     Pulse Rate 11/05/14 2139 65     Resp --      Temp 11/05/14 2139 97.6 F (36.4 C)     Temp Source 11/05/14 2139 Oral     SpO2 11/05/14 2139 99 %     Weight 11/05/14 2139 220 lb (99.791 kg)     Height 11/05/14 2139  (1.753 m)     Head Cir --  Peak Flow --      Pain Score --      Pain Loc --      Pain Edu? --      Excl. in GC? --     Constitutional: Alert and oriented. Well appearing and in mild acute distress. Eyes: Conjunctivae are normal. PERRL. EOMI. Head: Atraumatic. Nose: No congestion/rhinnorhea. Mouth/Throat: Mucous membranes are moist.  Oropharynx non-erythematous. Neck: No stridor.   Cardiovascular: Normal rate, regular rhythm. Grossly normal heart sounds.  Good peripheral circulation. Respiratory: Normal respiratory effort.  No retractions. Lungs CTAB. Gastrointestinal: Soft, tender to palpation right upper quadrant without rebound or guarding. No distention. No abdominal bruits. Mild right CVA  tenderness. Genitourinary: Deferred. Musculoskeletal: No lower extremity tenderness nor edema.  No joint effusions. Neurologic:  Normal speech and language. No gross focal neurologic deficits are appreciated. Speech is normal. No gait instability. Skin:  Skin is warm, dry and intact. No rash noted. Psychiatric: Mood and affect are normal. Speech and behavior are normal.  ____________________________________________   LABS (all labs ordered are listed, but only abnormal results are displayed)  Labs Reviewed  COMPREHENSIVE METABOLIC PANEL - Abnormal; Notable for the following:    Glucose, Bld 118 (*)    All other components within normal limits  URINALYSIS COMPLETEWITH MICROSCOPIC ([facility] ONLY) - Abnormal; Notable for the following:    Color, Urine YELLOW (*)    APPearance CLEAR (*)    Hgb urine dipstick 3+ (*)    Bacteria, UA RARE (*)    Squamous Epithelial / LPF 0-5 (*)    All other components within normal limits  CBC  LIPASE, BLOOD   ____________________________________________  EKG  None ____________________________________________  RADIOLOGY  Ultrasound renal interpreted by Dr. Grace IsaacWatts: No explanation for patient's right-sided flank pain. Specifically, no definite echogenic renal stones or evidence of right-sided urinary obstruction.  Ultrasound abdomen limited RUQ interpreted per Dr. Grace IsaacWatts: Normal right upper quadrant ultrasound.  ____________________________________________   PROCEDURES  Procedure(s) performed: None  Critical Care performed: No  ____________________________________________   INITIAL IMPRESSION / ASSESSMENT AND PLAN / ED COURSE  Pertinent labs & imaging results that were available during my care of the patient were reviewed by me and considered in my medical decision making (see chart for details).  39 year old male with an extensive history of kidney stones presenting with sudden-onset right upper quadrant pain radiating to right flank.  Microscopic hematuria noted. Given family history of cholelithiasis, will add lipase and obtain a right upper quadrant ultrasound. IV fluid resuscitation and IV analgesia will be given.  ----------------------------------------- 4:03 AM on 11/06/2014 ----------------------------------------- Patient improved. Discussed with patient and his brother results of lab work and ultrasound sounds. Suspect kidney stone given microscopic hematuria. Will proceed with analgesia, antiemetic, Flomax and urology follow-up. Strict return precautions given. Both verbalize understanding and agree with plan of care.   ____________________________________________   FINAL CLINICAL IMPRESSION(S) / ED DIAGNOSES  Final diagnoses:  Pain  RUQ pain  Right upper quadrant pain  Kidney stone      Irean HongJade J Viren Lebeau, MD 11/06/14 [phone]

## 2014-11-06 NOTE — Discharge Instructions (Signed)
1. Take medicines as needed for pain and nausea (Percocet/Zofran #20). 2. Take Flomax 0.4 mg daily 14 days. 3. Take plenty of bottled or filtered water daily. 4. Return to the ER for worsening symptoms, persistent vomiting, fever, difficult breathing or other concerns.  Abdominal Pain Many things can cause abdominal pain. Usually, abdominal pain is not caused by a disease and will improve without treatment. It can often be observed and treated at home. Your health care provider will do a physical exam and possibly order blood tests and X-rays to help determine the seriousness of your pain. However, in many cases, more time must pass before a clear cause of the pain can be found. Before that point, your health care provider may not know if you need more testing or further treatment. HOME CARE INSTRUCTIONS  Monitor your abdominal pain for any changes. The following actions may help to alleviate any discomfort you are experiencing:  Only take over-the-counter or prescription medicines as directed by your health care provider.  Do not take laxatives unless directed to do so by your health care provider.  Try a clear liquid diet (broth, tea, or water) as directed by your health care provider. Slowly move to a bland diet as tolerated. SEEK MEDICAL CARE IF:  You have unexplained abdominal pain.  You have abdominal pain associated with nausea or diarrhea.  You have pain when you urinate or have a bowel movement.  You experience abdominal pain that wakes you in the night.  You have abdominal pain that is worsened or improved by eating food.  You have abdominal pain that is worsened with eating fatty foods.  You have a fever. SEEK IMMEDIATE MEDICAL CARE IF:   Your pain does not go away within 2 hours.  You keep throwing up (vomiting).  Your pain is felt only in portions of the abdomen, such as the right side or the left lower portion of the abdomen.  You pass bloody or black tarry  stools. MAKE SURE YOU:  Understand these instructions.   Will watch your condition.   Will get help right away if you are not doing well or get worse.  Document Released: 03/08/2005 Document Revised: 06/03/2013 Document Reviewed: 02/05/2013 [facility] Patient Information 2015 [place], [place]. This information is not intended to replace advice given to you by your health care provider. Make sure you discuss any questions you have with your health care provider.  Flank Pain Flank pain refers to pain that is located on the side of the body between the upper abdomen and the back. The pain may occur over a short period of time (acute) or may be long-term or reoccurring (chronic). It may be mild or severe. Flank pain can be caused by many things. CAUSES  Some of the more common causes of flank pain include:  Muscle strains.   Muscle spasms.   A disease of your spine (vertebral disk disease).   A lung infection (pneumonia).   Fluid around your lungs (pulmonary edema).   A kidney infection.   Kidney stones.   A very painful skin rash caused by the chickenpox virus (shingles).   Gallbladder disease.  HOME CARE INSTRUCTIONS  Home care will depend on the cause of your pain. In general,  Rest as directed by your caregiver.  Drink enough fluids to keep your urine clear or pale yellow.  Only take over-the-counter or prescription medicines as directed by your caregiver. Some medicines may help relieve the pain.  Tell your caregiver about  any changes in your pain.  Follow up with your caregiver as directed. SEEK IMMEDIATE MEDICAL CARE IF:   Your pain is not controlled with medicine.   You have new or worsening symptoms.  Your pain increases.   You have abdominal pain.   You have shortness of breath.   You have persistent nausea or vomiting.   You have swelling in your abdomen.   You feel faint or pass out.   You have blood in your urine.  You have a  fever or persistent symptoms for more than 2-3 days.  You have a fever and your symptoms suddenly get worse. MAKE SURE YOU:   Understand these instructions.  Will watch your condition.  Will get help right away if you are not doing well or get worse. Document Released: 07/20/2005 Document Revised: 02/21/2012 Document Reviewed: 01/11/2012 [facility] Patient Information 2015 [place], [place]. This information is not intended to replace advice given to you by your health care provider. Make sure you discuss any questions you have with your health care provider.  Kidney Stones Kidney stones (urolithiasis) are deposits that form inside your kidneys. The intense pain is caused by the stone moving through the urinary tract. When the stone moves, the ureter goes into spasm around the stone. The stone is usually passed in the urine.  CAUSES   A disorder that makes certain neck glands produce too much parathyroid hormone (primary hyperparathyroidism).  A buildup of uric acid crystals, similar to gout in your joints.  Narrowing (stricture) of the ureter.  A kidney obstruction present at birth (congenital obstruction).  Previous surgery on the kidney or ureters.  Numerous kidney infections. SYMPTOMS   Feeling sick to your stomach (nauseous).  Throwing up (vomiting).  Blood in the urine (hematuria).  Pain that usually spreads (radiates) to the groin.  Frequency or urgency of urination. DIAGNOSIS   Taking a history and physical exam.  Blood or urine tests.  CT scan.  Occasionally, an examination of the inside of the urinary bladder (cystoscopy) is performed. TREATMENT   Observation.  Increasing your fluid intake.  Extracorporeal shock wave lithotripsy--This is a noninvasive procedure that uses shock waves to break up kidney stones.  Surgery may be needed if you have severe pain or persistent obstruction. There are various surgical procedures. Most of the procedures are  performed with the use of small instruments. Only small incisions are needed to accommodate these instruments, so recovery time is minimized. The size, location, and chemical composition are all important variables that will determine the proper choice of action for you. Talk to your health care provider to better understand your situation so that you will minimize the risk of injury to yourself and your kidney.  HOME CARE INSTRUCTIONS   Drink enough water and fluids to keep your urine clear or pale yellow. This will help you to pass the stone or stone fragments.  Strain all urine through the provided strainer. Keep all particulate matter and stones for your health care provider to see. The stone causing the pain may be as small as a grain of salt. It is very important to use the strainer each and every time you pass your urine. The collection of your stone will allow your health care provider to analyze it and verify that a stone has actually passed. The stone analysis will often identify what you can do to reduce the incidence of recurrences.  Only take over-the-counter or prescription medicines for pain, discomfort, or fever as directed  by your health care provider.  Make a follow-up appointment with your health care provider as directed.  Get follow-up X-rays if required. The absence of pain does not always mean that the stone has passed. It may have only stopped moving. If the urine remains completely obstructed, it can cause loss of kidney function or even complete destruction of the kidney. It is your responsibility to make sure X-rays and follow-ups are completed. Ultrasounds of the kidney can show blockages and the status of the kidney. Ultrasounds are not associated with any radiation and can be performed easily in a matter of minutes. SEEK MEDICAL CARE IF:  You experience pain that is progressive and unresponsive to any pain medicine you have been prescribed. SEEK IMMEDIATE MEDICAL CARE  IF:   Pain cannot be controlled with the prescribed medicine.  You have a fever or shaking chills.  The severity or intensity of pain increases over 18 hours and is not relieved by pain medicine.  You develop a new onset of abdominal pain.  You feel faint or pass out.  You are unable to urinate. MAKE SURE YOU:   Understand these instructions.  Will watch your condition.  Will get help right away if you are not doing well or get worse. Document Released: 05/29/2005 Document Revised: 01/29/2013 Document Reviewed: 10/30/2012 [facility] Patient Information 2015 [place]. This information is not intended to replace advice given to you by your health care provider. Make sure you discuss any questions you have with your health care provider.

## 2014-11-06 NOTE — ED Notes (Signed)
Patient transported to Ultrasound 

## 2015-09-23 IMAGING — US US ABDOMEN LIMITED
1 series · 14 of 25 positions shown · non-contrast
Comparison: None.

CLINICAL DATA: Right upper quadrant pain since last night

EXAM:
US ABDOMEN LIMITED - RIGHT UPPER QUADRANT

[Series 1: us abdomen limited · 0.21mm/px · 14 of 39 slices shown]
[im 1/39]
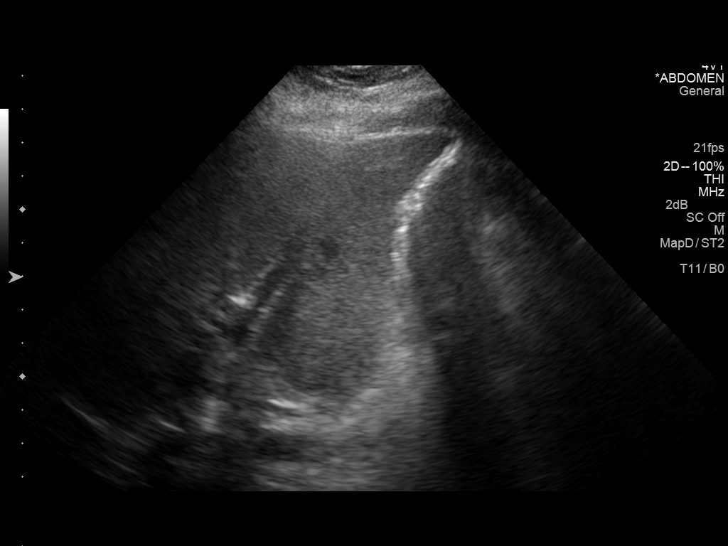
[im 4/39]
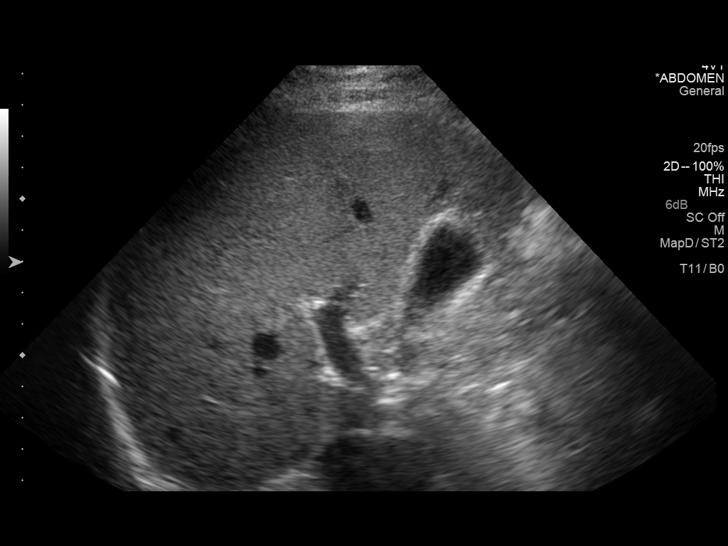
[im 7/39]
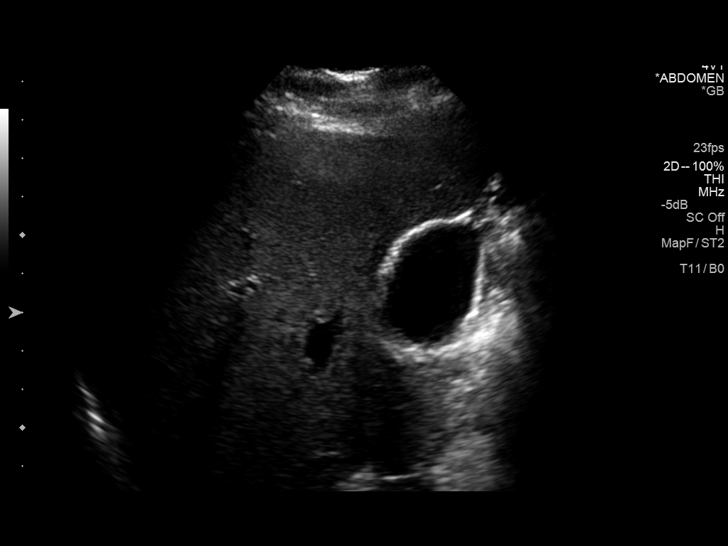
[im 10/39]
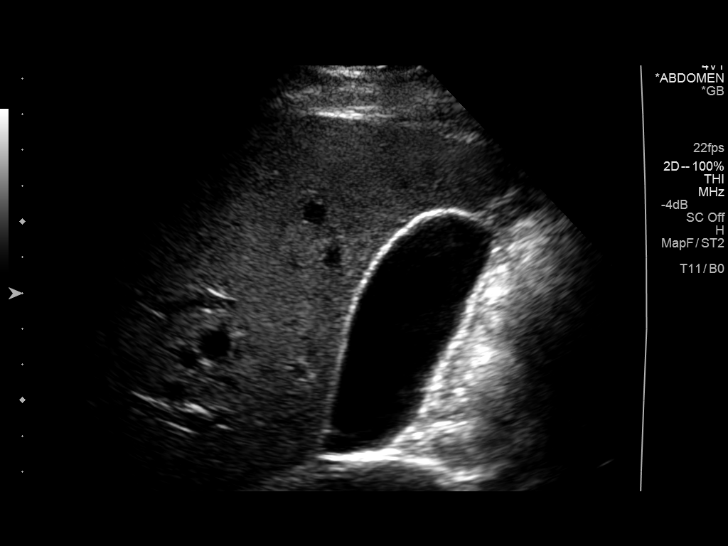
[im 13/39]
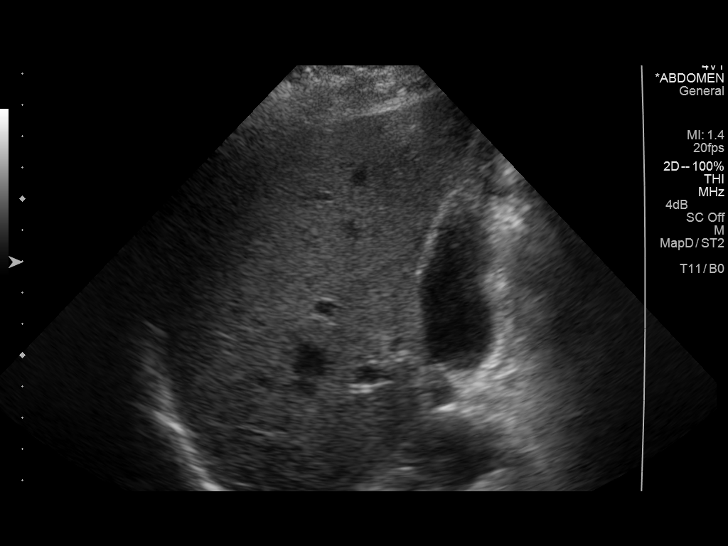
[im 15/39]
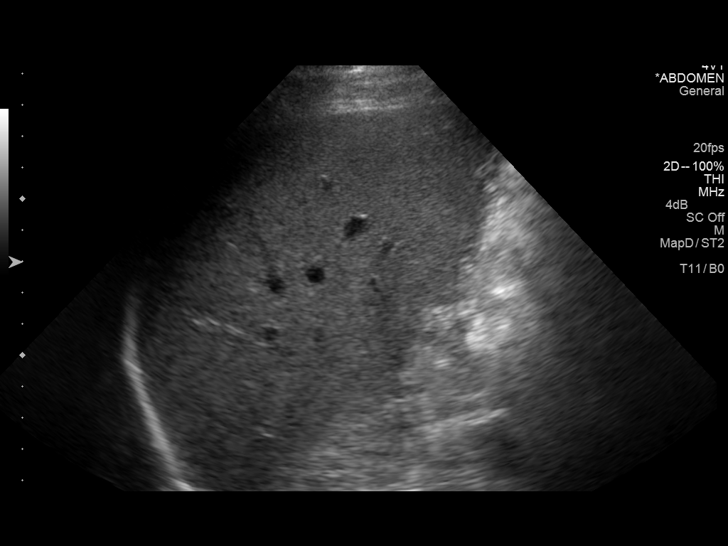
[im 18/39]
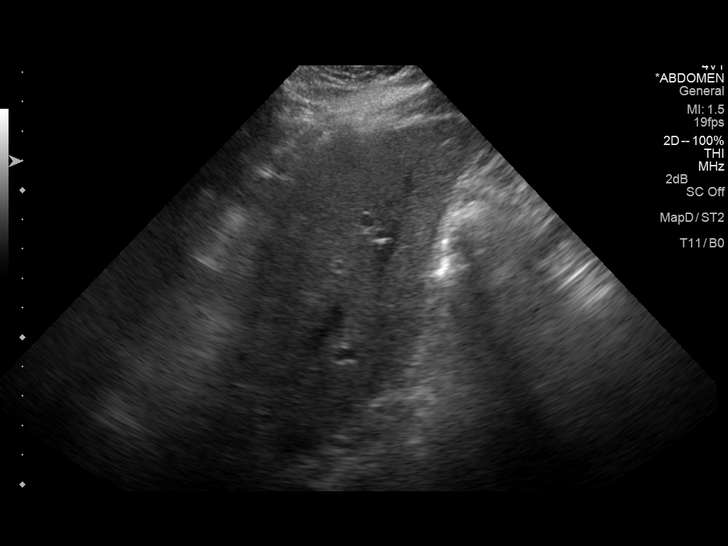
[im 21/39]
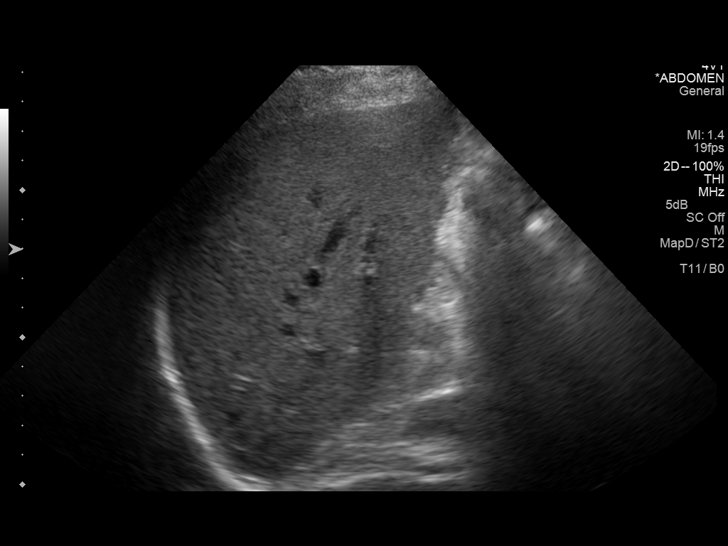
[im 24/39]
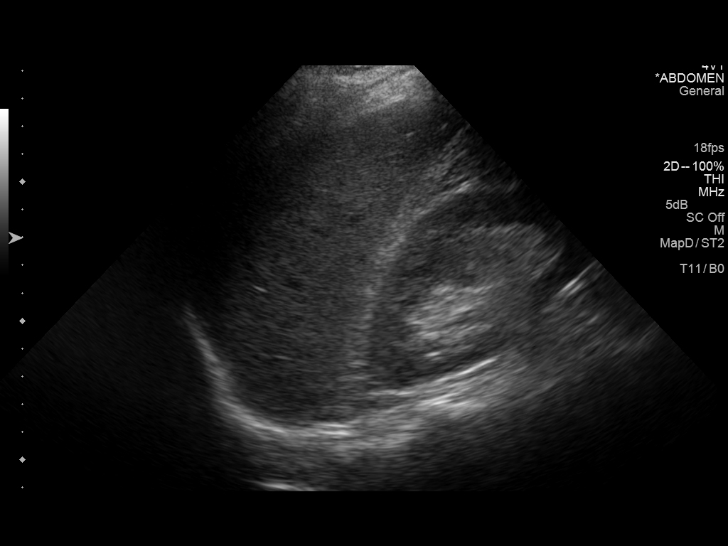
[im 26/39]
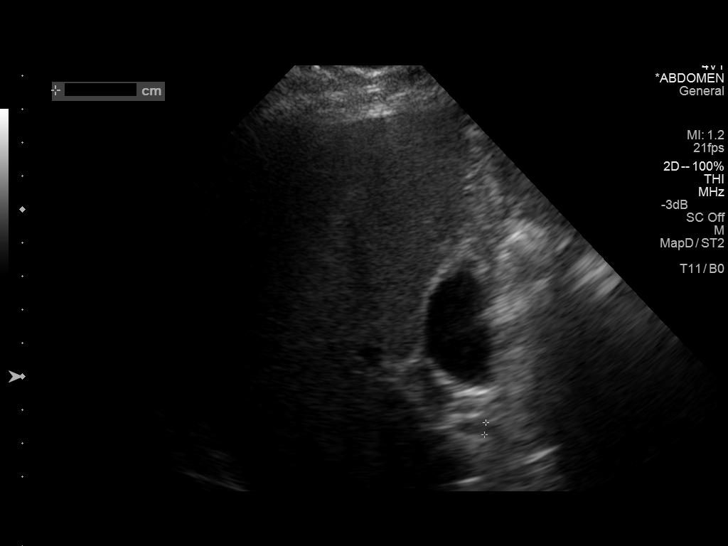
[im 29/39]
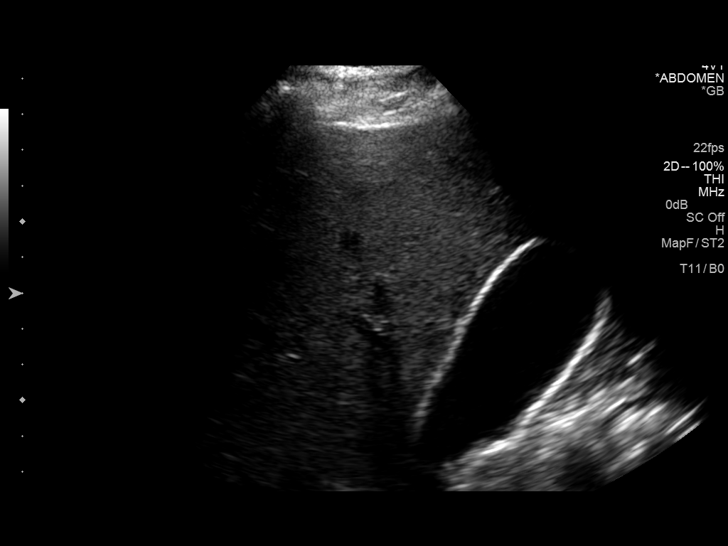
[im 32/39]
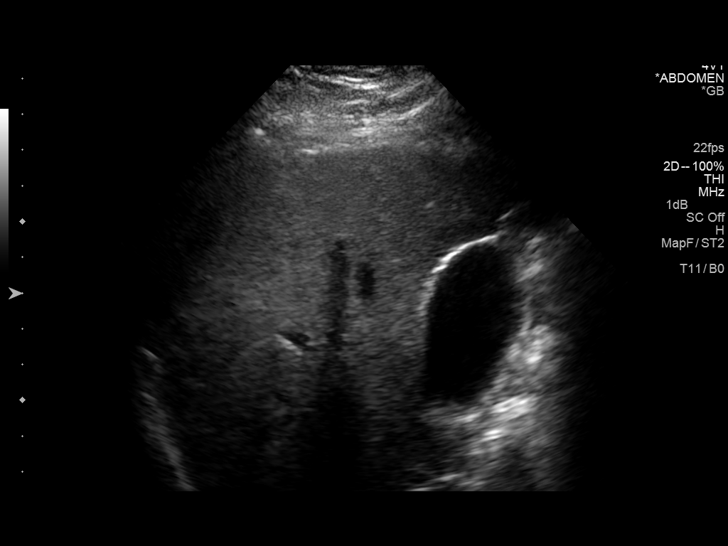
[im 35/39]
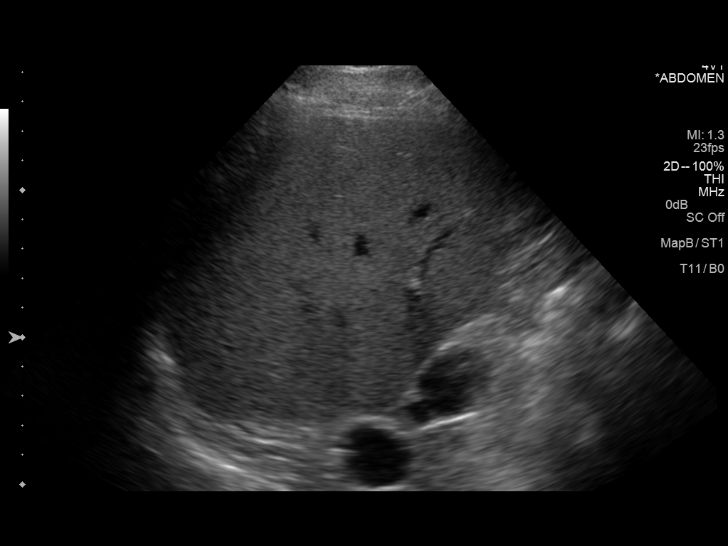
[im 39/39]
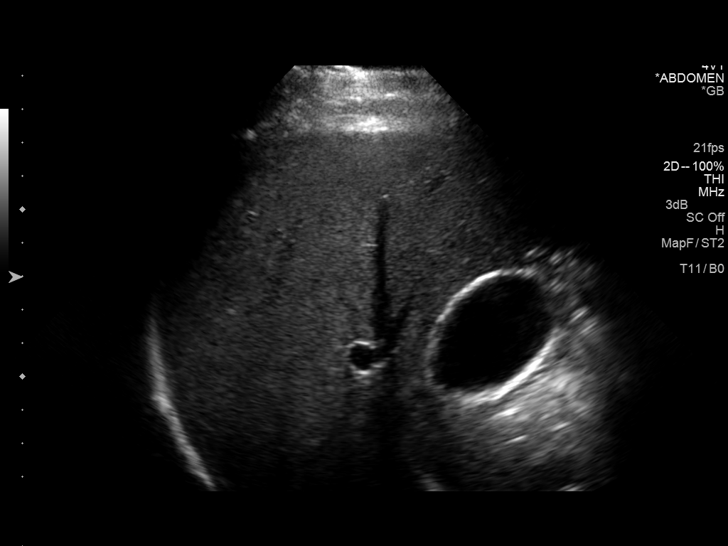

[14 of 25 positions shown; findings below may reference images not displayed]

FINDINGS: Gallbladder:

No gallstones or wall thickening visualized. No sonographic Murphy
sign noted.

Common bile duct:

Diameter: 4 mm

Liver:

No focal lesion identified. Within normal limits in parenchymal
echogenicity. Antegrade flow in the imaged portal venous system.
IMPRESSION: Normal right upper quadrant ultrasound.

## 2016-01-16 ENCOUNTER — Emergency Department: Payer: BLUE CROSS/BLUE SHIELD

## 2016-01-16 ENCOUNTER — Emergency Department
Admission: EM | Admit: 2016-01-16 | Discharge: 2016-01-17 | Discharge status: Against Medical Advice | Payer: BLUE CROSS/BLUE SHIELD | Attending: Emergency Medicine | Admitting: Emergency Medicine

## 2016-01-16 DIAGNOSIS — R112 Nausea with vomiting, unspecified: Secondary | ICD-10-CM | POA: Diagnosis not present

## 2016-01-16 DIAGNOSIS — R197 Diarrhea, unspecified: Secondary | ICD-10-CM | POA: Diagnosis not present

## 2016-01-16 DIAGNOSIS — R079 Chest pain, unspecified: Secondary | ICD-10-CM | POA: Diagnosis present

## 2016-01-16 LAB — CBC
HCT: 40.5 % (ref 40.0–52.0)
HEMOGLOBIN: 14.3 g/dL (ref 13.0–18.0)
MCH: 30.7 pg (ref 26.0–34.0)
MCHC: 35.4 g/dL (ref 32.0–36.0)
MCV: 86.6 fL (ref 80.0–100.0)
PLATELETS: 261 10*3/uL (ref 150–440)
RBC: 4.67 MIL/uL (ref 4.40–5.90)
RDW: 13 % (ref 11.5–14.5)
WBC: 7.7 10*3/uL (ref 3.8–10.6)

## 2016-01-16 LAB — BASIC METABOLIC PANEL
ANION GAP: 8 (ref 5–15)
BUN: 15 mg/dL (ref 6–20)
CALCIUM: 8.5 mg/dL — AB (ref 8.9–10.3)
CO2: 24 mmol/L (ref 22–32)
CREATININE: 0.74 mg/dL (ref 0.61–1.24)
Chloride: 108 mmol/L (ref 101–111)
GFR calc non Af Amer: >60 mL/min (ref >60)
Glucose, Bld: 96 mg/dL (ref 65–99)
Potassium: 3.7 mmol/L (ref 3.5–5.1)
SODIUM: 140 mmol/L (ref 135–145)

## 2016-01-16 LAB — VALPROIC ACID LEVEL: Valproic Acid Lvl: 79 ug/mL (ref 50.0–100.0)

## 2016-01-16 LAB — TROPONIN I

## 2016-01-16 MED ORDER — PROMETHAZINE HCL 25 MG/ML IJ SOLN
12.5000 mg | Freq: Once | INTRAMUSCULAR | Status: AC
  Administered 2016-01-16: 12.5 mg via INTRAVENOUS
  Filled 2016-01-16: qty 1

## 2016-01-16 MED ORDER — DIAZEPAM 5 MG/ML IJ SOLN
INTRAMUSCULAR | Status: AC
  Administered 2016-01-16: 2 mg via INTRAVENOUS
  Filled 2016-01-16: qty 2

## 2016-01-16 MED ORDER — ONDANSETRON HCL 4 MG/2ML IJ SOLN
INTRAMUSCULAR | Status: AC
  Administered 2016-01-16: 4 mg via INTRAVENOUS
  Filled 2016-01-16: qty 2

## 2016-01-16 MED ORDER — FAMOTIDINE IN NACL 20-0.9 MG/50ML-% IV SOLN
20.0000 mg | Freq: Once | INTRAVENOUS | Status: AC
Start: 2016-01-16 — End: 2016-01-16
  Administered 2016-01-16: 20 mg via INTRAVENOUS
  Filled 2016-01-16: qty 50

## 2016-01-16 MED ORDER — DIAZEPAM 5 MG/ML IJ SOLN
2.0000 mg | Freq: Once | INTRAMUSCULAR | Status: AC
  Administered 2016-01-16: 2 mg via INTRAVENOUS

## 2016-01-16 MED ORDER — ONDANSETRON HCL 4 MG/2ML IJ SOLN
4.0000 mg | Freq: Once | INTRAMUSCULAR | Status: AC
  Administered 2016-01-16: 4 mg via INTRAVENOUS

## 2016-01-16 MED ORDER — SODIUM CHLORIDE 0.9 % IV BOLUS (SEPSIS)
1000.0000 mL | Freq: Once | INTRAVENOUS | Status: AC
  Administered 2016-01-16: 1000 mL via INTRAVENOUS

## 2016-01-16 NOTE — ED Triage Notes (Signed)
Pt presents to ED with c/o LEFT-sided chest pain with radiation into LEFT shoulder and arm with LEFT hand numbness that started around 8am this morning. Pt denies any previous cardiac history. Pt reports (+) nausea and vomiting, shortness of breath. Pt is A&O, in NAD, with respirations even, regular and unlabored

## 2016-01-16 NOTE — ED Notes (Addendum)
Pt reports having history of bipolar disorder with anxiety.  Pt states that he is unsure if the chest pain is more related to his anxiety than anything else.  Pt does not appear anxious at this time, pt appears relaxed and resting in bed.  Pt states that his psychiatrist has him on hydroxyzine hcl , but patient reports that this does not help him.  Pt also states that he has been having diarrhea and nausea for "a couple of days", but states that he's never thrown up.  Pt reports that he has a decreased appetite and does not want to drink fluids for fear of vomiting.

## 2016-01-16 NOTE — ED Provider Notes (Signed)
[facility] Emergency Department Provider Note   ____________________________________________   First MD Initiated Contact with Patient 01/16/16 2008     (approximate)  I have reviewed the triage vital signs and the nursing notes.   HISTORY  Chief Complaint Chest Pain   HPI Bruce Chapman is a 40 y.o. male with a history of headaches as well as kidney stones who is presenting to the emergency department with left-sided chest pain. He says that the pain started this morning at about 8 AM. He says the pain is sharp and nonradiating but he says he also has tingling in his left fingers. He says that he vomited twice and also had one episode of diarrhea. Denies any blood in his vomit or diarrhea. Says that he has also had some shortness of breath and pain upon deep inspiration. He denies any hormone supplementation or smoking. Denies any history of heart attack in his family. Denies any drug abuse or drinking. He says that the pain has been intermittent and will last several hours at a time throughout the day. Says that he is completely pain-free at this time.   Past Medical History:  Diagnosis Date  . Generalized headaches   . Kidney stones     There are no active problems to display for this patient.   Past Surgical History:  Procedure Laterality Date  . KIDNEY STONE SURGERY    . URETERAL STENT PLACEMENT      Prior to Admission medications   Medication Sig Start Date End Date Taking? Authorizing Provider  citalopram (CELEXA) 20 MG tablet Take 20 mg by mouth daily. 01/03/16  Yes Historical Provider, MD  divalproex (DEPAKOTE ER) 250 MG 24 hr tablet Take 1,250 mg by mouth at bedtime. 12/23/15  Yes Historical Provider, MD  hydrOXYzine (ATARAX/VISTARIL) 50 MG tablet Take 1 tablet by mouth 2 (two) times daily as needed. 01/07/16  Yes Historical Provider, MD  QUEtiapine (SEROQUEL) 50 MG tablet Take 1-2 tablets by mouth at bedtime. 12/23/15  Yes Historical  Provider, MD  ziprasidone (GEODON) 40 MG capsule Take 1 capsule by mouth every evening. 12/31/15  Yes Historical Provider, MD    Allergies Review of patient's allergies indicates no known allergies.  No family history on file.  Social History Social History  Substance Use Topics  . Smoking status: Never Smoker  . Smokeless tobacco: Never Used  . Alcohol use No    Review of Systems Constitutional: No fever/chills Eyes: No visual changes. ENT: No sore throat. Cardiovascular: As above Respiratory:  as above Gastrointestinal: No abdominal pain.  No constipation. Genitourinary: Negative for dysuria. Musculoskeletal: Negative for back pain. Skin: Negative for rash. Neurological: Negative for headaches, focal weakness or numbness.  10-point ROS otherwise negative.  ____________________________________________   PHYSICAL EXAM:  VITAL SIGNS: ED Triage Vitals  Enc Vitals Group     BP 01/16/16 1958 (!) 131/110     Pulse Rate 01/16/16 1958 70     Resp 01/16/16 1958 16     Temp 01/16/16 1958 98.3 F (36.8 C)     Temp Source 01/16/16 1958 Oral     SpO2 01/16/16 1958 97 %     Weight 01/16/16 1958 220 lb (99.8 kg)     Height 01/16/16 1958 5\' 9"  (1.753 m)     Head Circumference --      Peak Flow --      Pain Score 01/16/16 1959 7     Pain Loc --  Pain Edu? --      Excl. in GC? --     Constitutional: Alert and oriented. Well appearing and in no acute distress. Eyes: Conjunctivae are normal. PERRL. EOMI. Head: Atraumatic. Nose: No congestion/rhinnorhea. Mouth/Throat: Mucous membranes are moist.   Neck: No stridor.   Cardiovascular: Normal rate, regular rhythm. Grossly normal heart sounds.  Good peripheral circulation With equal, intact bilateral radial as well as dorsalis pedis pulses. Tenderness to the left pectoralis major muscle which reproduces the pain.Respiratory: Normal respiratory effort.  No retractions. Lungs CTAB. Gastrointestinal: Soft and nontender. No  distention. No abdominal bruits. No CVA tenderness. Musculoskeletal: No lower extremity tenderness nor edema.  No joint effusions. Neurologic:  Normal speech and language. No gross focal neurologic deficits are appreciated.  Skin:  Skin is warm, dry and intact. No rash noted. Psychiatric: Mood and affect are normal. Speech and behavior are normal.  ____________________________________________   LABS (all labs ordered are listed, but only abnormal results are displayed)  Labs Reviewed  BASIC METABOLIC PANEL - Abnormal; Notable for the following:       Result Value   Calcium 8.5 (*)    All other components within normal limits  CBC  TROPONIN I  VALPROIC ACID LEVEL  TROPONIN I   ____________________________________________  EKG  ED ECG REPORT I, Arelia Longest, the attending physician, personally viewed and interpreted this ECG.   Date: 01/16/2016  EKG Time: 2002  Rate: 60  Rhythm: normal sinus rhythm  Axis: Normal  Intervals:none  ST&T Change: No ST segment elevation or depression. No abnormal T-wave inversion.  ____________________________________________  RADIOLOGY  DG Chest 2 View (Accession [id number]) (Order [id number])  Imaging  Date: 01/16/2016 Department: [facility] EMERGENCY DEPARTMENT Released By: Wilford Sports., RN (auto-released) Authorizing: Myrna Blazer, MD  PACS Images   Show images for DG Chest 2 View  Study Result   CLINICAL DATA:  Left chest pain radiating into the left arm to the finger tips beginning at 8 a.m. this morning.  EXAM: CHEST  2 VIEW  COMPARISON:  None.  FINDINGS: The lungs are clear. Heart size is normal. No pneumothorax or pleural effusion. No bony abnormality.  IMPRESSION: Negative chest.   Electronically Signed   By: Drusilla Kanner M.D.   On: 01/16/2016 20:33    ____________________________________________   PROCEDURES  Procedure(s) performed:    Procedures  Critical Care performed:   ____________________________________________   INITIAL IMPRESSION / ASSESSMENT AND PLAN / ED COURSE  Pertinent labs & imaging results that were available during my care of the patient were reviewed by me and considered in my medical decision making (see chart for details).  PERC negative.   Clinical Course  ----------------------------------------- 1110 PM on 01/16/2016 ----------------------------------------- Patient requested further medication for dizziness and nausea. He has requested Phenergan. We will give him a trial of Phenergan. He also one episode of vomiting which I believe this will also help with. Very reassuring workup as far as cardiac pathology. Symptoms seemed more likely related from a gastrointestinal illness with chest wall pain. The patient is pending a second troponin. Also, the patient has reassuring chest x-ray as well as equal pulses to the upper and lower extremities. Unlikely to be aortic pathology. Signed out to Dr. Huel Cote.    ____________________________________________   FINAL CLINICAL IMPRESSION(S) / ED DIAGNOSES  Left-sided chest pain. Nausea vomiting and diarrhea.    NEW MEDICATIONS STARTED DURING THIS VISIT:  New Prescriptions   No medications on  file     Note:  This document was prepared using Dragon voice recognition software and may include unintentional dictation errors.    Myrna Blazeravid Matthew Mahrukh Seguin, MD 01/16/16 [phone]

## 2016-01-16 NOTE — ED Notes (Signed)
Pt to xray at this time.  Pt stated that he was supposed to have depakote levels checked tomorrow and requesting if we can do them here since bloodwork will be drawn.  EDP okayed order.

## 2016-01-16 NOTE — ED Notes (Addendum)
EDP notified of patient's nausea/vomiting and verbal orders received.

## 2016-01-16 NOTE — ED Notes (Signed)
Pt provided with ice water and peanut butter and crackers per EDP approval.  Pt states that nausea is decreased, but continues to have dizziness and headache.

## 2016-01-17 NOTE — ED Provider Notes (Signed)
-----------------------------------------   12:29 AM on 01/17/2016 -----------------------------------------   Blood pressure 136/80, pulse (!) 55, temperature 98.3 F (36.8 C), temperature source Oral, resp. rate 18, height 5\' 9"  (1.753 m), weight 220 lb (99.8 kg), SpO2 95 %.  Assuming care from Dr. Langston MaskerShaevitz.  In short, Bruce Chapman is a 40 y.o. male with a chief complaint of Chest Pain .  Refer to the original H&P for additional details.  The current plan of care is to *repeat a second troponin for the patient in ruling out ischemic chest pain. The patient elected to leave AGAINST MEDICAL ADVICE even though was time for his troponin to be redrawn. Review of his laboratory work, [facility] manageretc. does not show any findings that would be concerning for acute ischemia. EKG appears normal. Patient was advised to stay for the second troponin but still elected to leave AGAINST MEDICAL ADVICE. He was signed out appropriately given instructions on chest pain and advised follow-up with his primary physician.   Jennye MoccasinBrian S Brittny Spangle, MD 01/17/16 [place] [id number]

## 2016-01-17 NOTE — ED Notes (Signed)
Pt requesting to leave, refusing 2nd troponin.  EDP notified.

## 2016-01-17 NOTE — Discharge Instructions (Signed)
Return to emergency department if he wishes to complete testing. Please contact her primary physician and arrange outpatient follow-up Please return immediately if condition worsens. Please contact her primary physician or the physician you were given for referral. If you have any specialist physicians involved in her treatment and plan please also contact them. Thank you for using [facility].

## 2016-01-17 NOTE — ED Notes (Signed)
Pt left AMA.  Verbalized understanding to come back if chest pain returns.  No other needs or requests at this time.

## 2016-12-02 IMAGING — CR DG CHEST 2V
2 series · 2 of 2 positions shown · non-contrast
Comparison: None.

CLINICAL DATA: Left chest pain radiating into the left arm to the
finger tips beginning at 8 a.m. this morning.

EXAM:
CHEST  2 VIEW

[chest pa]
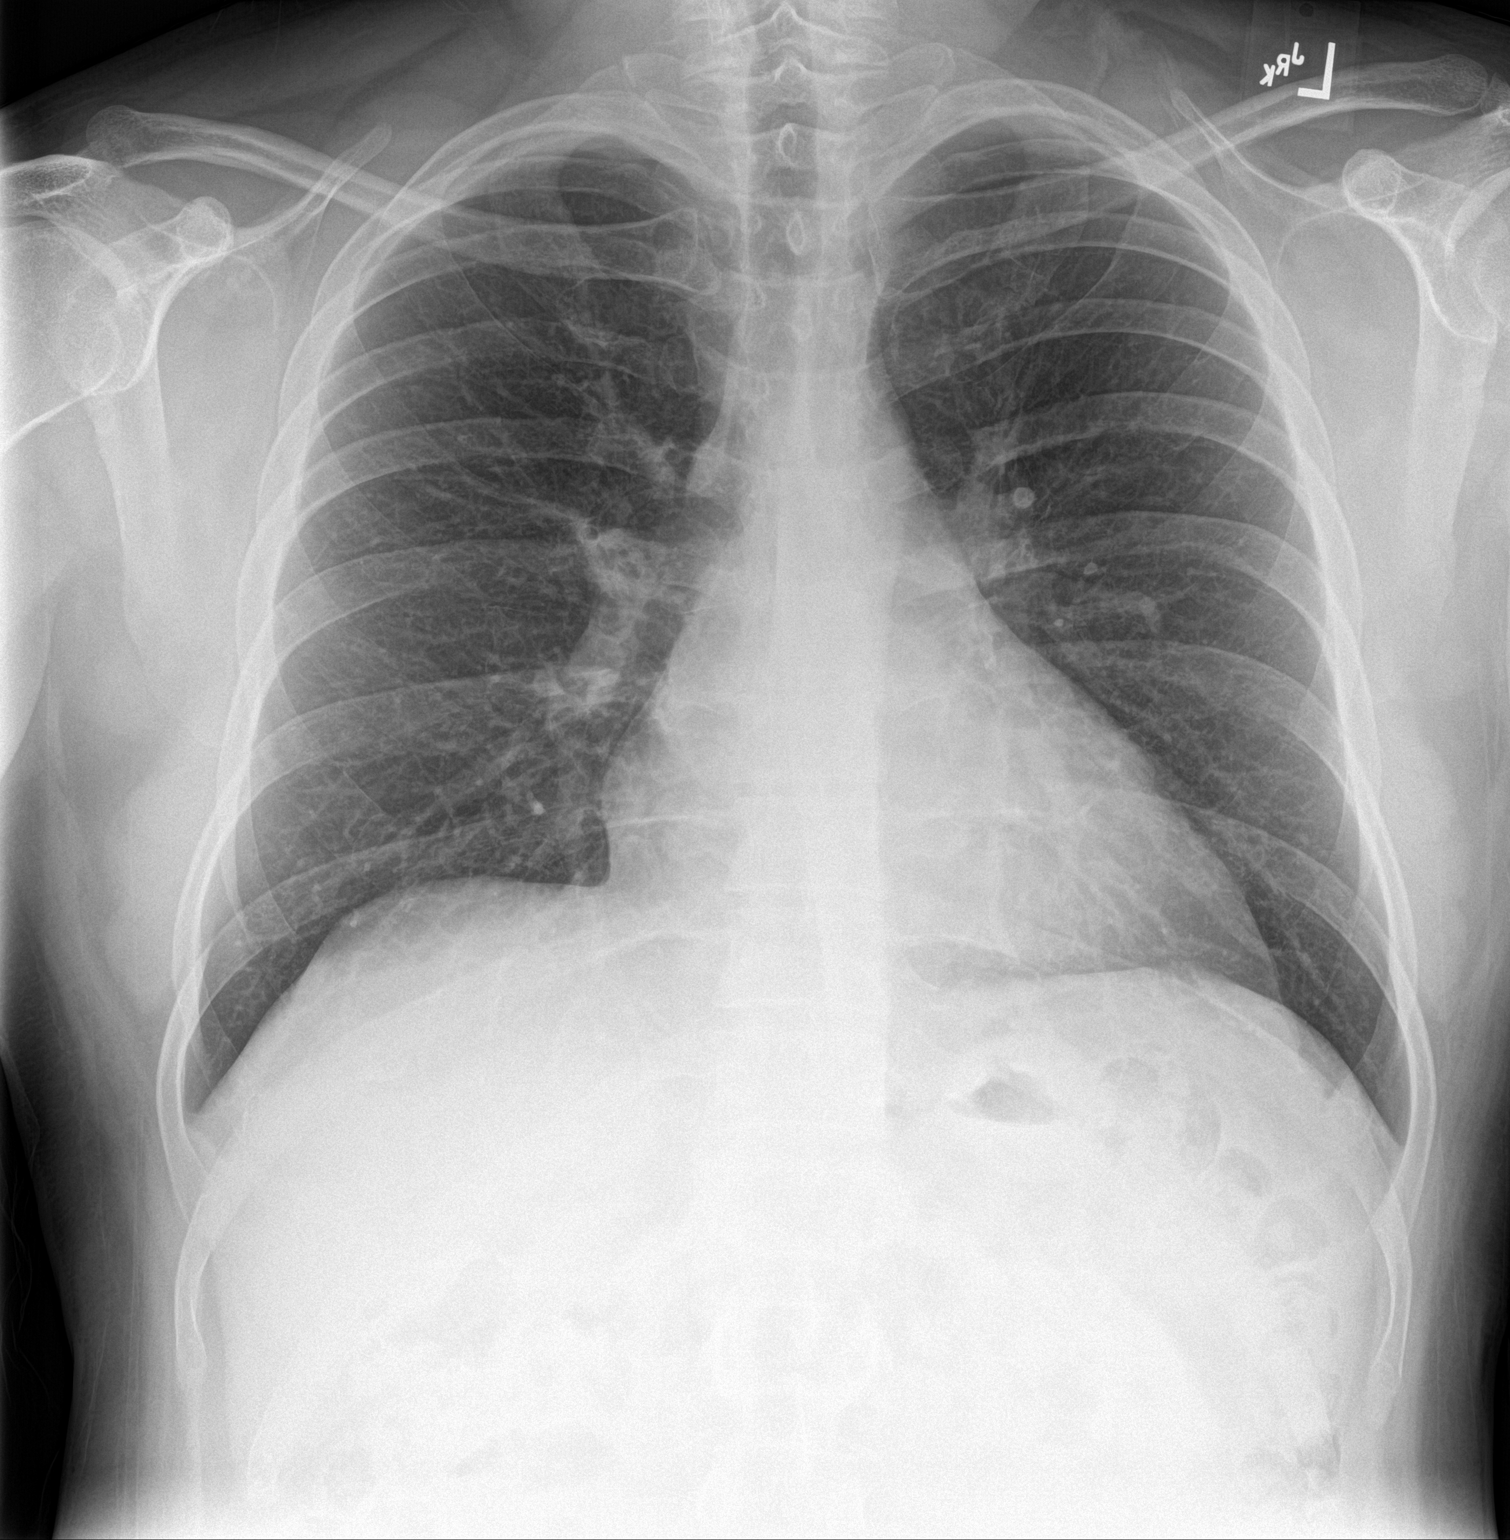

[chest lat]
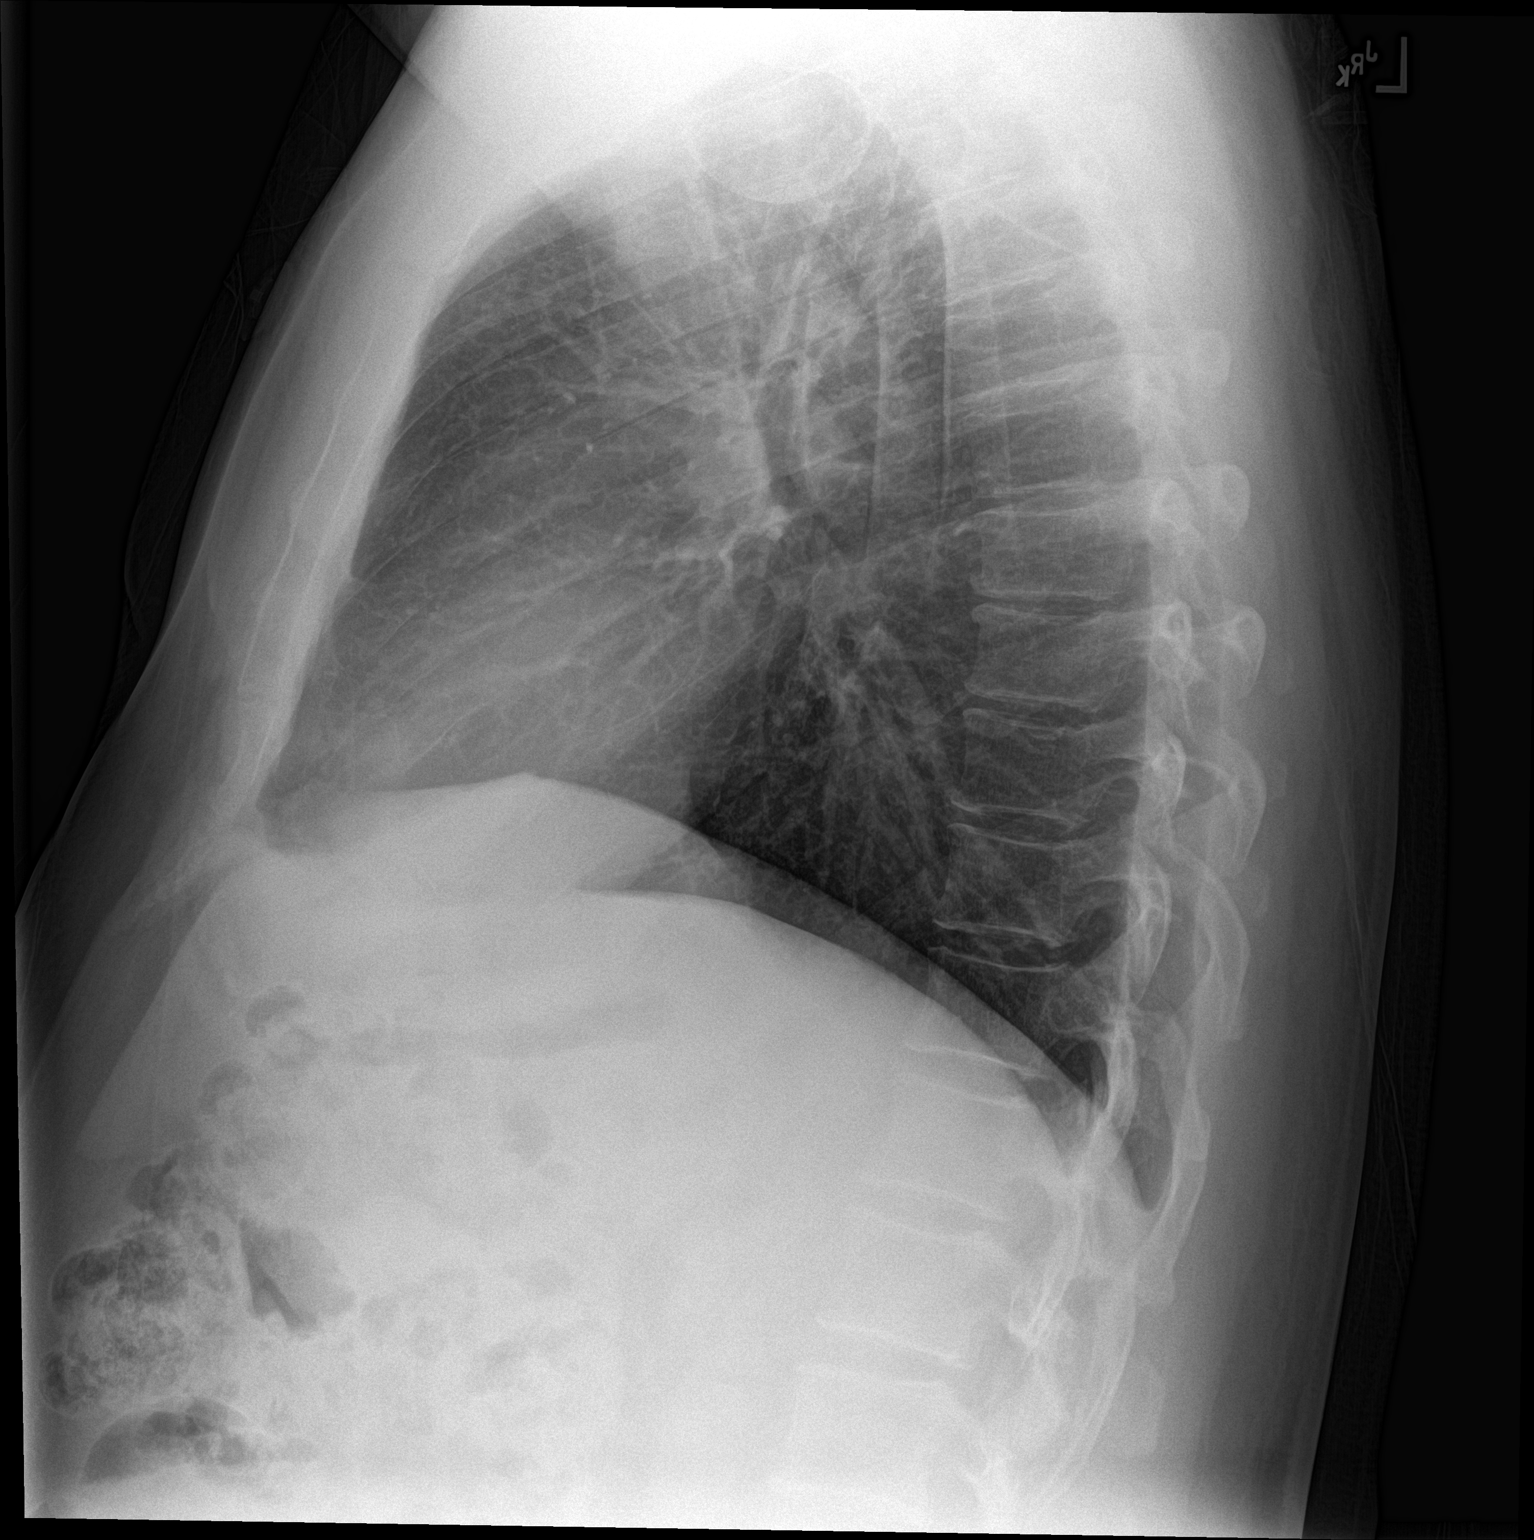

[2 of 2 positions shown; findings below may reference images not displayed]

FINDINGS: The lungs are clear. Heart size is normal. No pneumothorax or
pleural effusion. No bony abnormality.
IMPRESSION: Negative chest.

## 2017-01-18 LAB — ABO GROUP AND RH (OUTSIDE ORGANIZATIONS): ABORH Blood Type: O POS — NL

## 2019-09-24 ENCOUNTER — Ambulatory Visit: Payer: Commercial Managed Care - PPO

## 2019-09-24 DIAGNOSIS — Z23 Encounter for immunization: Secondary | ICD-10-CM

## 2019-09-24 NOTE — Progress Notes (Signed)
Patient WAS wearing a surgical mask  Droplet precautions were followed when caring for the patient.   PPE used by provider during encounter: Surgical mask, Face Shield/Goggles and Gloves    The patient is eligible to receive the COVID-19 Vaccine at this time: Yes    The patient has received a COVID vaccine previously: No      The patient acknowledges receipt of the Fact Sheet for Recipients and Caregivers for the Pfizer-BioNtech vaccine, which includes information about the risks and benefits of the vaccine and available alternatives. The patient was informed the FDA has authorized the emergency use of the COVID-19 Vaccine, which is not an FDA-approved vaccine and that the patient may accept or refuse the vaccine.    Pre-Screening Documentation:          COVID-19 Immunization Allergies  1. Have you ever had a severe allergic reaction to a previous dose of the COVID-19 Vaccine or to any ingredient of the COVID-19 Vaccine? (Review ingredients in the Fact Sheet for Recipients and Caregivers): No  2. Have you ever had an immediate allergic reaction of any severity within 4 hours of receiving a previous dose of the COVID-19 Vaccine or any of its ingredients?: No  3. Have you ever had an immediate allergic reaction of any severity to polysorbate or polyethylene glycol [PEG]?: No  Did the patient answer yes to any of questions 1-3?: No (proceed to question 4)      COVID-19 Immunization Clinical Considerations  4.Are you pregnant or breastfeeding?: Not Applicable  5.Do you have a weakened immune system caused by something such as HIV infection or cancer, or do you take medications that affect the immune system?: No  6. Do you have a bleeding disorder or are you taking blood thinners? : No  Did the patient answer yes to any of questions 4-6? : No (proceed to question 7)      COVID-19 Immunization Deferral Considerations  7. Have you received any other vaccines within the past 14 days? : No  8. Have you had a confirmed  COVID-19 exposure in the past 14 days?: No  9. Have you tested positive for COVID-19 in the last 14 days? : No  10. Are you feeling sick today? : No  Did the patient answer yes to any of questions 7-10?: No (proceed to question 11)       COVID-19 Immunization Antibody Deferral Considerations  11. In the past 90 days, have you tested positive for COVID-19 and recieved treatment?: Yes  If yes, what type of treatment?: The patient did NOT recieve monoclonal antibody or convalescent plasma treatment       COVID-19 Immunization Dermal Filler Consideration  12. Have you received a dermal filler injection?: No (proceed to question 13)      COVID-19 Immunization Observation Considerations  13. Do you have a history of immediate allergic reaction of any severity to a vaccine?: No  14. Do you have a history of immediate allergic reaction of any severity to an injectable medication?: No  15. Do you have a history of severe allergic reaction due to any cause? : No  Did the patient answer yes to any of questions 13-15? : No  If no, the patient was informed a 15-minute observation period is recommended after the vaccine to watch for a reaction.: Confirmed        All patient questions were answered and the patient agrees to receive the COVID-19 vaccine today.     Vaccine prepared   and administered according to the current Emergency Use Authorization and Fact Sheet for Healthcare Providers Administering Vaccine.    Patient given vaccine card and discharge instructions with information about the Fact Sheet for Recipients and Caregivers and the v-safe program. Patient directed to observation area.     Ajwa Kimberley, LVN

## 2019-10-15 ENCOUNTER — Ambulatory Visit: Payer: Commercial Managed Care - PPO | Attending: Family Medicine

## 2019-10-15 DIAGNOSIS — Z23 Encounter for immunization: Secondary | ICD-10-CM

## 2019-10-15 NOTE — Progress Notes (Signed)
Patient WAS wearing a surgical mask  Droplet precautions were followed when caring for the patient.   PPE used by provider during encounter: Surgical mask, Face Shield/Goggles and Gloves    The patient is eligible to receive the COVID-19 Vaccine at this time: Yes    The patient has received a COVID vaccine previously: Yes   Immunization History   Administered Date(s) Administered    COVID-19 mRNA PF 30 mcg/0.3 mL (Pfizer)   09/24/2019            The patient acknowledges receipt of the Fact Sheet for Recipients and Caregivers for the Pfizer-BioNtech vaccine, which includes information about the risks and benefits of the vaccine and available alternatives. The patient was informed the FDA has authorized the emergency use of the COVID-19 Vaccine, which is not an FDA-approved vaccine and that the patient may accept or refuse the vaccine.    Pre-Screening Documentation:          COVID-19 Immunization Allergies  1. Have you ever had a severe allergic reaction to a previous dose of the COVID-19 Vaccine or to any ingredient of the COVID-19 Vaccine? (Review ingredients in the Fact Sheet for Recipients and Caregivers): No  2. Have you ever had an immediate allergic reaction of any severity within 4 hours of receiving a previous dose of the COVID-19 Vaccine or any of its ingredients?: No  3. Have you ever had an immediate allergic reaction of any severity to polysorbate or polyethylene glycol [PEG]?: No  Did the patient answer yes to any of questions 1-3?: No (proceed to question 4)      COVID-19 Immunization Clinical Considerations  4.Are you pregnant or breastfeeding?: Not Applicable  5.Do you have a weakened immune system caused by something such as HIV infection or cancer, or do you take medications that affect the immune system?: No  6. Do you have a bleeding disorder or are you taking blood thinners? : No  Did the patient answer yes to any of questions 4-6? : No (proceed to question 7)      COVID-19 Immunization  Deferral Considerations  7. Have you received any other vaccines within the past 14 days? : No  8. Have you had a confirmed COVID-19 exposure in the past 14 days?: No  9. Have you tested positive for COVID-19 in the last 14 days? : No  10. Are you feeling sick today? : No  Did the patient answer yes to any of questions 7-10?: No (proceed to question 11)       COVID-19 Immunization Antibody Deferral Considerations  11. In the past 90 days, have you tested positive for COVID-19 and recieved treatment?: No (proceed to question 12)       COVID-19 Immunization Dermal Filler Consideration  12. Have you received a dermal filler injection?: No (proceed to question 13)      COVID-19 Immunization Observation Considerations  13. Do you have a history of immediate allergic reaction of any severity to a vaccine?: No  14. Do you have a history of immediate allergic reaction of any severity to an injectable medication?: No  15. Do you have a history of severe allergic reaction due to any cause? : No  Did the patient answer yes to any of questions 13-15? : No  If no, the patient was informed a 15-minute observation period is recommended after the vaccine to watch for a reaction.: Confirmed        All patient questions were answered and the   patient agrees to receive the COVID-19 vaccine today.     Vaccine prepared and administered according to the current Emergency Use Authorization and Fact Sheet for Healthcare Providers Administering Vaccine.    Patient given vaccine card and discharge instructions with information about the Fact Sheet for Recipients and Caregivers and the v-safe program. Patient directed to observation area.     Elnore Cosens, LVN
